# Patient Record
Sex: Female | Born: 1975
Health system: Southern US, Community
[De-identification: ages and names within clinical notes are randomized; demographics above are authoritative.]

## PROBLEM LIST (undated history)

## (undated) DIAGNOSIS — E785 Hyperlipidemia, unspecified: Secondary | ICD-10-CM

## (undated) DIAGNOSIS — E538 Deficiency of other specified B group vitamins: Secondary | ICD-10-CM

## (undated) DIAGNOSIS — G35 Multiple sclerosis: Secondary | ICD-10-CM

## (undated) DIAGNOSIS — D509 Iron deficiency anemia, unspecified: Secondary | ICD-10-CM

## (undated) DIAGNOSIS — D649 Anemia, unspecified: Secondary | ICD-10-CM

## (undated) DIAGNOSIS — Z8042 Family history of malignant neoplasm of prostate: Secondary | ICD-10-CM

## (undated) DIAGNOSIS — J189 Pneumonia, unspecified organism: Secondary | ICD-10-CM

## (undated) DIAGNOSIS — C50919 Malignant neoplasm of unspecified site of unspecified female breast: Secondary | ICD-10-CM

## (undated) DIAGNOSIS — G709 Myoneural disorder, unspecified: Secondary | ICD-10-CM

## (undated) DIAGNOSIS — Z803 Family history of malignant neoplasm of breast: Secondary | ICD-10-CM

## (undated) HISTORY — PX: VAGINA SURGERY: SHX829

## (undated) HISTORY — PX: WISDOM TOOTH EXTRACTION: SHX21

## (undated) HISTORY — DX: Iron deficiency anemia, unspecified: D50.9

## (undated) HISTORY — DX: Deficiency of other specified B group vitamins: E53.8

## (undated) HISTORY — DX: Pneumonia, unspecified organism: J18.9

## (undated) HISTORY — DX: Multiple sclerosis: G35

## (undated) HISTORY — DX: Family history of malignant neoplasm of breast: Z80.3

## (undated) HISTORY — DX: Anemia, unspecified: D64.9

## (undated) HISTORY — DX: Hyperlipidemia, unspecified: E78.5

## (undated) HISTORY — DX: Family history of malignant neoplasm of prostate: Z80.42

## (undated) HISTORY — DX: Myoneural disorder, unspecified: G70.9

---
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
## 2014-04-28 DIAGNOSIS — C50919 Malignant neoplasm of unspecified site of unspecified female breast: Secondary | ICD-10-CM

## 2014-04-28 HISTORY — DX: Malignant neoplasm of unspecified site of unspecified female breast: C50.919

## 2014-08-27 HISTORY — PX: BREAST LUMPECTOMY: SHX2

## 2014-08-27 HISTORY — PX: BREAST REDUCTION SURGERY: SHX8

## 2014-08-30 DIAGNOSIS — D0512 Intraductal carcinoma in situ of left breast: Secondary | ICD-10-CM | POA: Insufficient documentation

## 2014-09-22 DIAGNOSIS — G35 Multiple sclerosis: Secondary | ICD-10-CM | POA: Insufficient documentation

## 2014-09-22 DIAGNOSIS — E785 Hyperlipidemia, unspecified: Secondary | ICD-10-CM | POA: Insufficient documentation

## 2015-02-09 DIAGNOSIS — R7989 Other specified abnormal findings of blood chemistry: Secondary | ICD-10-CM | POA: Insufficient documentation

## 2017-12-17 DIAGNOSIS — Z9889 Other specified postprocedural states: Secondary | ICD-10-CM | POA: Diagnosis not present

## 2017-12-17 DIAGNOSIS — Z923 Personal history of irradiation: Secondary | ICD-10-CM | POA: Diagnosis not present

## 2017-12-17 DIAGNOSIS — D0512 Intraductal carcinoma in situ of left breast: Secondary | ICD-10-CM | POA: Diagnosis not present

## 2017-12-17 DIAGNOSIS — Z86000 Personal history of in-situ neoplasm of breast: Secondary | ICD-10-CM | POA: Diagnosis not present

## 2018-12-23 DIAGNOSIS — Z08 Encounter for follow-up examination after completed treatment for malignant neoplasm: Secondary | ICD-10-CM | POA: Diagnosis not present

## 2018-12-23 DIAGNOSIS — Z86000 Personal history of in-situ neoplasm of breast: Secondary | ICD-10-CM | POA: Diagnosis not present

## 2018-12-23 DIAGNOSIS — Z923 Personal history of irradiation: Secondary | ICD-10-CM | POA: Diagnosis not present

## 2018-12-23 DIAGNOSIS — D0512 Intraductal carcinoma in situ of left breast: Secondary | ICD-10-CM | POA: Diagnosis not present

## 2019-02-01 ENCOUNTER — Encounter: Payer: Self-pay | Admitting: Family

## 2019-02-01 ENCOUNTER — Other Ambulatory Visit: Payer: Self-pay

## 2019-02-01 ENCOUNTER — Ambulatory Visit (INDEPENDENT_AMBULATORY_CARE_PROVIDER_SITE_OTHER): Payer: BC Managed Care – PPO | Admitting: Family

## 2019-02-01 ENCOUNTER — Other Ambulatory Visit (HOSPITAL_COMMUNITY)
Admission: RE | Admit: 2019-02-01 | Discharge: 2019-02-01 | Disposition: A | Discharge status: Home / Self Care | Payer: BC Managed Care – PPO | Source: Ambulatory Visit | Attending: Family | Admitting: Family

## 2019-02-01 VITALS — BP 90/71 | HR 70 | Temp 97.3°F | Resp 16 | Ht 63.4 in | Wt 290.0 lb

## 2019-02-01 DIAGNOSIS — Z01419 Encounter for gynecological examination (general) (routine) without abnormal findings: Secondary | ICD-10-CM | POA: Diagnosis not present

## 2019-02-01 DIAGNOSIS — G35 Multiple sclerosis: Secondary | ICD-10-CM

## 2019-02-01 DIAGNOSIS — Z0000 Encounter for general adult medical examination without abnormal findings: Secondary | ICD-10-CM

## 2019-02-01 DIAGNOSIS — Z853 Personal history of malignant neoplasm of breast: Secondary | ICD-10-CM | POA: Diagnosis not present

## 2019-02-01 LAB — HEPATIC FUNCTION PANEL
ALT: 12 U/L (ref 0–35)
AST: 12 U/L (ref 0–37)
Albumin: 3.7 g/dL (ref 3.5–5.2)
Alkaline Phosphatase: 83 U/L (ref 39–117)
Bilirubin, Direct: 0.1 mg/dL (ref 0.0–0.3)
Total Bilirubin: 0.3 mg/dL (ref 0.2–1.2)
Total Protein: 7 g/dL (ref 6.0–8.3)

## 2019-02-01 LAB — CBC WITH DIFFERENTIAL/PLATELET
Basophils Absolute: 0.1 10*3/uL (ref 0.0–0.1)
Basophils Relative: 0.8 % (ref 0.0–3.0)
Eosinophils Absolute: 0.1 10*3/uL (ref 0.0–0.7)
Eosinophils Relative: 1.6 % (ref 0.0–5.0)
HCT: 36.9 % (ref 36.0–46.0)
Hemoglobin: 11.7 g/dL — ABNORMAL LOW (ref 12.0–15.0)
Lymphocytes Relative: 23.4 % (ref 12.0–46.0)
Lymphs Abs: 1.7 10*3/uL (ref 0.7–4.0)
MCHC: 31.7 g/dL (ref 30.0–36.0)
MCV: 79.5 fl (ref 78.0–100.0)
Monocytes Absolute: 0.5 10*3/uL (ref 0.1–1.0)
Monocytes Relative: 6.5 % (ref 3.0–12.0)
Neutro Abs: 5 10*3/uL (ref 1.4–7.7)
Neutrophils Relative %: 67.7 % (ref 43.0–77.0)
Platelets: 311 10*3/uL (ref 150.0–400.0)
RBC: 4.64 Mil/uL (ref 3.87–5.11)
RDW: 16.8 % — ABNORMAL HIGH (ref 11.5–15.5)
WBC: 7.5 10*3/uL (ref 4.0–10.5)

## 2019-02-01 LAB — BASIC METABOLIC PANEL
BUN: 12 mg/dL (ref 6–23)
CO2: 30 mEq/L (ref 19–32)
Calcium: 9 mg/dL (ref 8.4–10.5)
Chloride: 98 mEq/L (ref 96–112)
Creatinine, Ser: 0.47 mg/dL (ref 0.40–1.20)
GFR: 143.43 mL/min (ref >60.00)
Glucose, Bld: 89 mg/dL (ref 70–99)
Potassium: 3.6 mEq/L (ref 3.5–5.1)
Sodium: 137 mEq/L (ref 135–145)

## 2019-02-01 LAB — LIPID PANEL
Cholesterol: 203 mg/dL — ABNORMAL HIGH (ref 0–200)
HDL: 57.2 mg/dL (ref >39.00)
LDL Cholesterol: 130 mg/dL — ABNORMAL HIGH (ref 0–99)
NonHDL: 146.05
Total CHOL/HDL Ratio: 4
Triglycerides: 81 mg/dL (ref 0.0–149.0)
VLDL: 16.2 mg/dL (ref 0.0–40.0)

## 2019-02-01 LAB — TSH: TSH: 1.77 u[IU]/mL (ref 0.35–4.50)

## 2019-02-01 NOTE — Progress Notes (Signed)
Subjective:    Patient ID: Elizabeth Dixon, female    DOB: 15-Dec-1975, 43 y.o.   MRN: [id number]  HPI   Patient is a 43 yr old female who presents today to establish care.   Pmhx is significant for:  Multiple sclerosis-diagnosed 2002, due to numbness. Had LP and MRI scans at that time. Did take copaxone for abou 4-5 years. She stopped taking to try to become pregnant. Has been off meds and asymptomatic since that time.  She has not seen a neurologist recently.    Breast Cancer- (DCIS) left breast s/p lumpectomy 2016. Then had radiation, no chemo.  She is followed by annual mammograms.  Reports mammo is up to date  Reports no specific concerns today.   Preventative care:    Immunizations: flu shot 9/14, tetanus 2010 Diet: she is down 41 pounds since June She has been doing Keto diet.  Exercise: walks some Pap Smear: due Mammogram: reports up to date    Review of Systems  Constitutional: Negative for unexpected weight change.  HENT: Negative for hearing loss and rhinorrhea.   Eyes: Negative for visual disturbance.  Respiratory: Negative for cough and shortness of breath.   Cardiovascular: Negative for chest pain and leg swelling.  Gastrointestinal: Negative for constipation and diarrhea.  Genitourinary: Negative for dysuria, frequency, hematuria and menstrual problem.  Musculoskeletal: Negative for arthralgias and myalgias.  Skin: Negative for rash.  Neurological: Negative for headaches.  Hematological: Negative for adenopathy.  Psychiatric/Behavioral:       Denies depression/anxiety   Past Medical History:  Diagnosis Date  . Breast cancer ([place])    left breast  . Multiple sclerosis ([place])      Social History   Socioeconomic History  . Marital status: Married    Spouse name: Not on file  . Number of children: Not on file  . Years of education: Not on file  . Highest education level: Not on file  Occupational History  . Occupation: [employer] features editor  Social Needs  .  Financial resource strain: Not hard at all  . Food insecurity    Worry: Never true    Inability: Never true  . Transportation needs    Medical: No    Non-medical: No  Tobacco Use  . Smoking status: Former Smoker    Types: Cigarettes    Quit date: 2006    Years since quitting: 14.7  . Smokeless tobacco: Never Used  Substance and Sexual Activity  . Alcohol use: Yes    Alcohol/week: 1.0 - 2.0 standard drinks    Types: 1 - 2 Shots of liquor per week    Comment: once a wk  . Drug use: Never  . Sexual activity: Yes    Partners: Male  Lifestyle  . Physical activity    Days per week: 0 days    Minutes per session: Not on file  . Stress: Not on file  Relationships  . Social connections    Talks on phone: More than three times a week    Gets together: Once a week    Attends religious service: More than 4 times per year    Active member of club or organization: Not on file    Attends meetings of clubs or organizations: Not on file    Relationship status: Not on file  . Intimate partner violence    Fear of current or ex partner: No    Emotionally abused: No    Physically abused: No  Forced sexual activity: No  Other Topics Concern  . Not on file  Social History Narrative   Works as an Geologist, engineering   Married-  [place] 2013 son and 2011 daughter   Dorie Rank from [place]   Enjoys children's sports   Enjoys outside activities   dog   Complete bachelors degree    Past Surgical History:  Procedure Laterality Date  . BREAST LUMPECTOMY Left 08/2014  . BREAST REDUCTION SURGERY Bilateral 08/2014  . CESAREAN SECTION  2013    Family History  Problem Relation Age of Onset  . Thyroid disease Mother   . Hypertension Father   . Prostate cancer Father     Not on File  Current Outpatient Medications on File Prior to Visit  Medication Sig Dispense Refill  . Melatonin 1 MG TABS Take by mouth.    . Multiple Vitamins-Minerals (VITAMIN D3 COMPLETE PO) Take by mouth.     No current  facility-administered medications on file prior to visit.     BP 90/71 (BP Location: Right Arm, Patient Position: Sitting, Cuff Size: Large)   Pulse 70   Temp (!) 97.3 F (36.3 C) (Temporal)   Resp 16   Ht 5' 3.4" (1.61 m)   Wt 290 lb (131.5 kg)   SpO2 100%   BMI 50.73 kg/m        Objective:   Physical Exam  Physical Exam  Constitutional: She is oriented to person, place, and time. She appears well-developed and well-nourished. No distress.  HENT:  Head: Normocephalic and atraumatic.  Right Ear: Tympanic membrane and ear canal normal.  Left Ear: Tympanic membrane and ear canal normal.  Mouth/Throat: not examined,pt wearing mask for covid-19 precautions Eyes: Pupils are equal, round, and reactive to light. No scleral icterus.  Neck: Normal range of motion. No thyromegaly present.  Cardiovascular: Normal rate and regular rhythm.   No murmur heard. Pulmonary/Chest: Effort normal and breath sounds normal. No respiratory distress. He has no wheezes. She has no rales. She exhibits no tenderness.  Abdominal: Soft. Bowel sounds are normal. She exhibits no distension and no mass. There is no tenderness. There is no rebound and no guarding.  Musculoskeletal: She exhibits no edema.  Lymphadenopathy:    She has no cervical adenopathy.  Neurological: She is alert and oriented to person, place, and time. She has normal patellar reflexes. She exhibits normal muscle tone. Coordination normal.  Skin: Skin is warm and dry.  Psychiatric: She has a normal mood and affect. Her behavior is normal. Judgment and thought content normal.  Breasts: Examined lying, bilateral scars from breast reduction.  Some post-radiation changes left lower breast.  No obvious breast masses  Inguinal/mons: Normal without inguinal adenopathy  External genitalia: Normal  BUS/Urethra/Skene's glands: Normal  Bladder: Normal  Vagina: Normal  Cervix: Normal  Uterus: normal in size, shape and contour. Midline and mobile   Adnexa/parametria:  Rt: Without masses or tenderness.  Lt: Without masses or tenderness.  Anus and perineum: + external hemorrhoid noted            Assessment & Plan:   Preventative care- discussed healthy diet, exercise, weight loss. Pap performed today. Mammogram is up to date.  Flu shot up to date.  Will need tetanus next visit.   Multiple Sclerosis- clinically stable. Will refer to neurology for surveillance.  Hx of breast cancer- s/p lumpectomy and radiation.      Assessment & Plan:

## 2019-02-01 NOTE — Patient Instructions (Signed)
Please go to the lab before leaving today.

## 2019-02-08 LAB — CYTOLOGY - PAP
Diagnosis: NEGATIVE
High risk HPV: NEGATIVE

## 2019-02-09 ENCOUNTER — Telehealth: Payer: Self-pay

## 2019-02-09 NOTE — Telephone Encounter (Signed)
No notes

## 2019-02-14 ENCOUNTER — Other Ambulatory Visit: Payer: Self-pay

## 2019-02-14 DIAGNOSIS — D649 Anemia, unspecified: Secondary | ICD-10-CM

## 2019-02-17 ENCOUNTER — Other Ambulatory Visit (INDEPENDENT_AMBULATORY_CARE_PROVIDER_SITE_OTHER): Payer: BC Managed Care – PPO

## 2019-02-17 ENCOUNTER — Other Ambulatory Visit: Payer: Self-pay

## 2019-02-17 DIAGNOSIS — D649 Anemia, unspecified: Secondary | ICD-10-CM

## 2019-02-17 LAB — FERRITIN: Ferritin: 13.9 ng/mL (ref 10.0–291.0)

## 2019-02-17 LAB — IRON: Iron: 35 ug/dL — ABNORMAL LOW (ref 42–145)

## 2019-02-18 ENCOUNTER — Telehealth: Payer: Self-pay | Admitting: Family

## 2019-02-18 ENCOUNTER — Other Ambulatory Visit: Payer: Self-pay

## 2019-02-18 DIAGNOSIS — E611 Iron deficiency: Secondary | ICD-10-CM

## 2019-02-18 MED ORDER — FERROUS SULFATE 325 (65 FE) MG PO TABS: 325.0000 mg | ORAL_TABLET | Freq: Every day | ORAL | 3 refills | Status: DC

## 2019-02-18 NOTE — Telephone Encounter (Signed)
Unable to reach patient by phone left voice mail for her to check information forwarded to her as a MyChart message.

## 2019-02-18 NOTE — Telephone Encounter (Signed)
Please advise pt that her iron level is low. I would like for her to add iron 325mg  bid (available OTC).  Repeat cbc, serum iron, ferritin in 3 months. Dx iron def anemia.

## 2019-03-13 NOTE — Progress Notes (Signed)
NEUROLOGY CONSULTATION NOTE  Elizabeth Dixon MRN: [id number] DOB: 1976/01/12  Referring provider: [name], NP Primary care provider: [name], NP  Reason for consult:  Multiple sclerosis  HISTORY OF PRESENT ILLNESS: Elizabeth Dixon is a 43 year old right-handed Caucasian female with history of left breast cancer who presents for multiple sclerosis.  History supplemented by referring provider note.  She was diagnosed with multiple sclerosis in 2002 after presenting with numbness and tingling in her feet that spread up to mid thorax by mid-day.  Diagnosis was established via MRI and lumbar puncture CSF results.  She was treated with IV steroids for exacerbations.  On a couple of subsequent occasions, she would have various numbness.  She was initially on Copaxone and discontinued it around 2008-2009 when she tried to get pregnant.  She hasn't been on a DMT since then.  Repeat MRIs in the past showed no changes.  No exacerbations since then.  No prior history of optic neuritis or weakness.  Vision:  No issues Motor:  No issues Sensory:  No issues Pain:  No issues Gait:  No issues Bowel/Bladder:  No issues Fatigue:  No issues Cognition:  No issues Mood:  No issues.  Current DMT:  None Past DMT:  Copaxone (4 to 5 years; stopped when she tried getting pregnant)  Current medications/vitamins-supplements:  D3 (unsure of dose); melatonin 1mg ; ferrous sulfate 325mg  mg; MVI Past medications:  None  No family history of MS.  02/01/2019 LABS:  CBC with WBC 7.5, HGB 11.7, HCT 36.9, PLT 311; BMP with Na 137, K 3.6, Cl 98, CO2 30, glucose 89, BUN 12, Cr 0.47; hepatic panel with t bili 0.3, ALP 83, AST 12, ALT 12; TSH 1.77  PAST MEDICAL HISTORY: Past Medical History:  Diagnosis Date  . Breast cancer ([place])    left breast  . Multiple sclerosis ([place])     PAST SURGICAL HISTORY: Past Surgical History:  Procedure Laterality Date  . BREAST LUMPECTOMY Left 08/2014  . BREAST  REDUCTION SURGERY Bilateral 08/2014  . CESAREAN SECTION  2013    MEDICATIONS: Current Outpatient Medications on File Prior to Visit  Medication Sig Dispense Refill  . ferrous sulfate 325 (65 FE) MG tablet Take 1 tablet (325 mg total) by mouth daily with breakfast.  3  . Melatonin 1 MG TABS Take by mouth.    . Multiple Vitamins-Minerals (VITAMIN D3 COMPLETE PO) Take by mouth.     No current facility-administered medications on file prior to visit.     ALLERGIES: Not on File  FAMILY HISTORY: Family History  Problem Relation Age of Onset  . Thyroid disease Mother   . Hypertension Father   . Prostate cancer Father    SOCIAL HISTORY: Social History   Socioeconomic History  . Marital status: Married    Spouse name: Not on file  . Number of children: Not on file  . Years of education: Not on file  . Highest education level: Not on file  Occupational History  . Occupation: [employer] features editor  Social Needs  . Financial resource strain: Not hard at all  . Food insecurity    Worry: Never true    Inability: Never true  . Transportation needs    Medical: No    Non-medical: No  Tobacco Use  . Smoking status: Former Smoker    Types: Cigarettes    Quit date: 2006    Years since quitting: 14.8  . Smokeless tobacco: Never Used  Substance and Sexual Activity  .  Alcohol use: Yes    Alcohol/week: 1.0 - 2.0 standard drinks    Types: 1 - 2 Shots of liquor per week    Comment: once a wk  . Drug use: Never  . Sexual activity: Yes    Partners: Male  Lifestyle  . Physical activity    Days per week: 0 days    Minutes per session: Not on file  . Stress: Not on file  Relationships  . Social connections    Talks on phone: More than three times a week    Gets together: Once a week    Attends religious service: More than 4 times per year    Active member of club or organization: Not on file    Attends meetings of clubs or organizations: Not on file    Relationship status: Not on file  .  Intimate partner violence    Fear of current or ex partner: No    Emotionally abused: No    Physically abused: No    Forced sexual activity: No  Other Topics Concern  . Not on file  Social History Narrative   Works as an Geologist, engineering   Married-  [place] 2013 son and 2011 daughter   Moved from [place]   Enjoys children's sports   Enjoys outside activities   dog   Complete bachelors degree    REVIEW OF SYSTEMS: Constitutional: No fevers, chills, or sweats, no generalized fatigue, change in appetite Eyes: No visual changes, double vision, eye pain Ear, nose and throat: No hearing loss, ear pain, nasal congestion, sore throat Cardiovascular: No chest pain, palpitations Respiratory:  No shortness of breath at rest or with exertion, wheezes GastrointestinaI: No nausea, vomiting, diarrhea, abdominal pain, fecal incontinence Genitourinary:  No dysuria, urinary retention or frequency Musculoskeletal:  No neck pain, back pain Integumentary: No rash, pruritus, skin lesions Neurological: as above Psychiatric: No depression, insomnia, anxiety Endocrine: No palpitations, fatigue, diaphoresis, mood swings, change in appetite, change in weight, increased thirst Hematologic/Lymphatic:  No purpura, petechiae. Allergic/Immunologic: no itchy/runny eyes, nasal congestion, recent allergic reactions, rashes  PHYSICAL EXAM: Blood pressure 116/79, pulse 64, height 5\' 4"  (1.626 m), weight 285 lb (129.3 kg), SpO2 99 %. General: No acute distress.  Patient appears well-groomed.   Head:  Normocephalic/atraumatic Eyes:  fundi examined but not visualized Neck: supple, no paraspinal tenderness, full range of motion Back: No paraspinal tenderness Heart: regular rate and rhythm Lungs: Clear to auscultation bilaterally. Vascular: No carotid bruits. Neurological Exam: Mental status: alert and oriented to person, place, and time, recent and remote memory intact, fund of knowledge intact, attention and concentration  intact, speech fluent and not dysarthric, language intact. Cranial nerves: CN I: not tested CN II: pupils equal, round and reactive to light, visual fields intact CN III, IV, VI:  full range of motion, no nystagmus, no ptosis CN V: facial sensation intact CN VII: upper and lower face symmetric CN VIII: hearing intact CN IX, X: gag intact, uvula midline CN XI: sternocleidomastoid and trapezius muscles intact CN XII: tongue midline Bulk & Tone: normal, no fasciculations. Motor:  5/5 throughout  Sensation:  Pinprick and vibration sensation intact Deep Tendon Reflexes:  2+ throughout, toes downgoing.   Finger to nose testing:  Without dysmetria.   Heel to shin:  Without dysmetria.   Gait:  Normal station and stride.  Able to turn and tandem walk. Romberg negative Negative Lhermitte's sign.  IMPRESSION: Multiple sclerosis.  Clinically stable for many years off of DMT.  I recommended that she start a DMT as she does have a risk for future exacerbations.  PLAN: 1.  Check baseline MRI of brain and cervical spine with and without contrast.  Would repeat in one year (prior to follow up) 2.  Check vitamin D level.  She will contact us with dose of D3 and I can have her adjust dose accordingly if needed. 3.  Provided her information on Aubagio and Tecfidera.  She will consider and get back to [place] if she chooses to start one of these DMT. 4.  Follow up in one year (following repeat MRI).  If she decides to start DMT, I will also have her follow up in 6 months.  Thank you for allowing me to take part in the care of this patient.  Metta Clines, DO  CC: [name], NP

## 2019-03-15 ENCOUNTER — Encounter: Payer: Self-pay | Admitting: Neurology

## 2019-03-15 ENCOUNTER — Ambulatory Visit (INDEPENDENT_AMBULATORY_CARE_PROVIDER_SITE_OTHER): Payer: BC Managed Care – PPO | Admitting: Neurology

## 2019-03-15 ENCOUNTER — Other Ambulatory Visit (INDEPENDENT_AMBULATORY_CARE_PROVIDER_SITE_OTHER): Payer: BC Managed Care – PPO

## 2019-03-15 ENCOUNTER — Other Ambulatory Visit: Payer: Self-pay

## 2019-03-15 VITALS — BP 116/79 | HR 64 | Ht 64.0 in | Wt 285.0 lb

## 2019-03-15 DIAGNOSIS — G35 Multiple sclerosis: Secondary | ICD-10-CM | POA: Diagnosis not present

## 2019-03-15 LAB — VITAMIN D 25 HYDROXY (VIT D DEFICIENCY, FRACTURES): VITD: 36.56 ng/mL (ref 30.00–100.00)

## 2019-03-15 NOTE — Patient Instructions (Addendum)
1.  We will check MRI of brain and cervical spine with and without contrast 2.  We will check vitamin D level.  Contact us with dose of D3 3.  Consider starting one of the following medications:  Tecfidera  Aubagio 4.  If you decide to start a medication, follow up in 6 months, otherwise follow up in one year.  A referral to [facility] has been placed for your MRI someone will contact you directly to schedule your appt. They are located at [place]. Please contact them directly by calling [phone] with any questions regarding your referral.

## 2019-04-16 ENCOUNTER — Other Ambulatory Visit: Payer: Self-pay

## 2019-04-16 ENCOUNTER — Ambulatory Visit
Admission: RE | Admit: 2019-04-16 | Discharge: 2019-04-16 | Disposition: A | Discharge status: Home / Self Care | Payer: BC Managed Care – PPO | Source: Ambulatory Visit | Attending: Neurology | Admitting: Neurology

## 2019-04-16 DIAGNOSIS — G35 Multiple sclerosis: Secondary | ICD-10-CM

## 2019-04-16 DIAGNOSIS — M50223 Other cervical disc displacement at C6-C7 level: Secondary | ICD-10-CM | POA: Diagnosis not present

## 2019-04-16 IMAGING — MR MR CERVICAL SPINE WO/W CM
6 of 8 series · 34 of 48 positions shown · IV contrast (multihance)
Comparison: None.

CLINICAL DATA: Multiple sclerosis.

EXAM:
MRI HEAD WITHOUT AND WITH CONTRAST
MRI CERVICAL SPINE WITHOUT AND WITH CONTRAST
TECHNIQUE: Multiplanar, multiecho pulse sequences of the brain and surrounding
structures, and cervical spine, to include the craniocervical
junction and cervicothoracic junction, were obtained without and
with intravenous contrast.
CONTRAST:  20mL MULTIHANCE GADOBENATE DIMEGLUMINE 529 MG/ML IV SOLN

[Series 2: T1 · sagittal · 3.0mm · 0.82mm/px · 5 of 17 slices shown (1 of 2)]
[im 1/17]
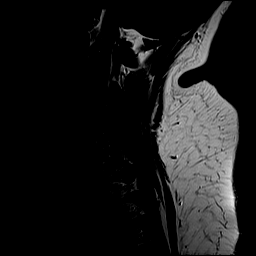
[im 5/17]
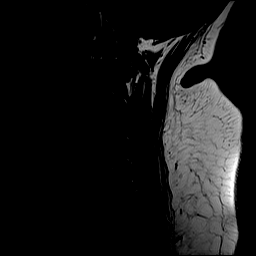
[im 9/17]
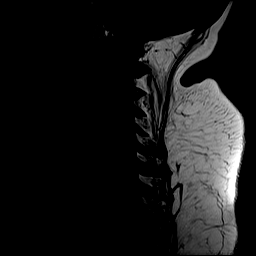
[im 13/17]
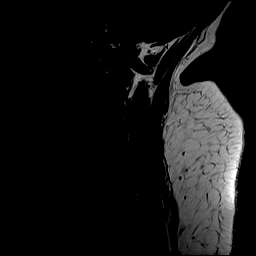
[im 17/17]
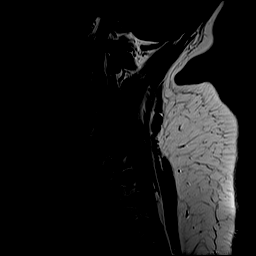

[Series 3: STIR · sagittal · 3.0mm · 0.82mm/px · 5 of 17 slices shown]
[im 1/17]
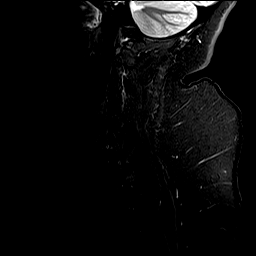
[im 5/17]
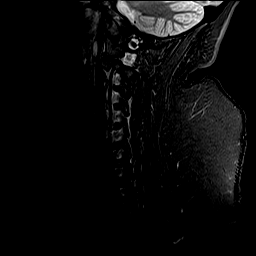
[im 9/17]
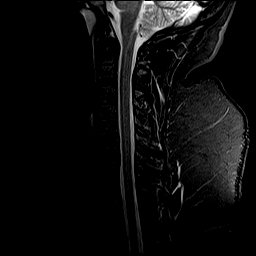
[im 13/17]
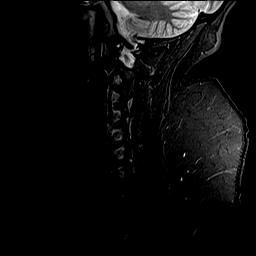
[im 17/17]
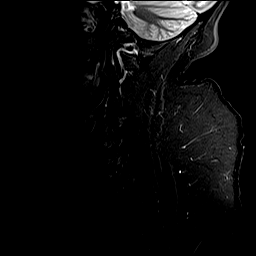

[Series 4: T2 · axial · 3.0mm · 0.70mm/px · z∈[-211,-122]mm · 7 of 25 slices shown (1 of 2)]
[im 1/25]
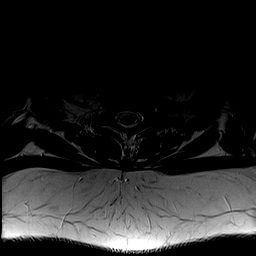
[im 5/25]
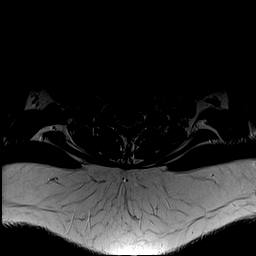
[im 9/25]
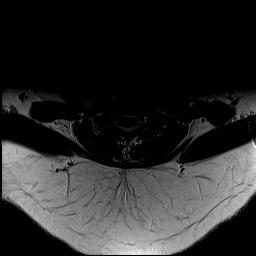
[im 13/25]
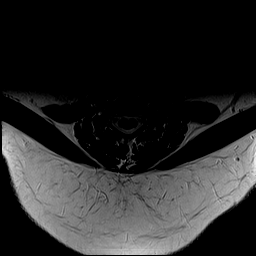
[im 17/25]
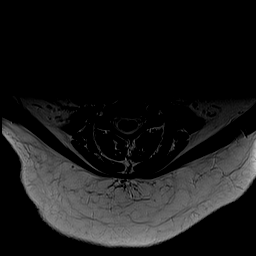
[im 21/25]
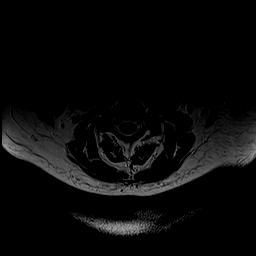
[im 25/25]
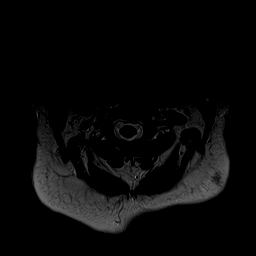

[Series 6: T1 · axial · 3.0mm · 0.35mm/px · z∈[-211,-122]mm · 7 of 25 slices shown (2 of 2)]
[im 1/25]
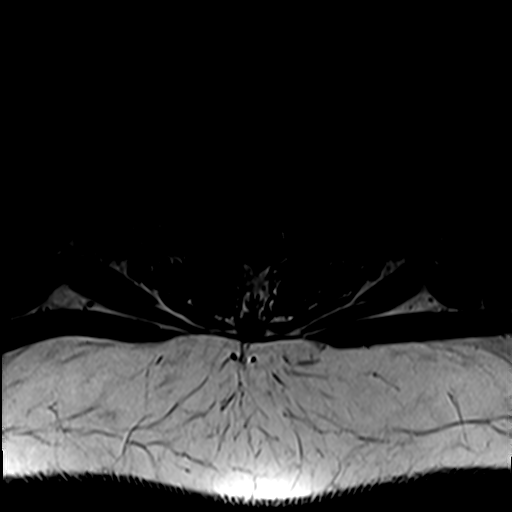
[im 5/25]
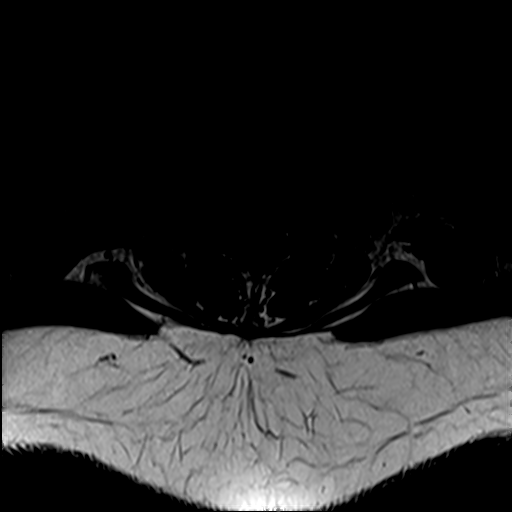
[im 9/25]
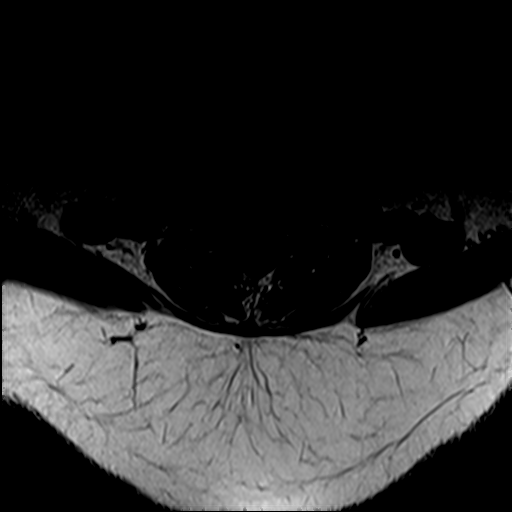
[im 13/25]
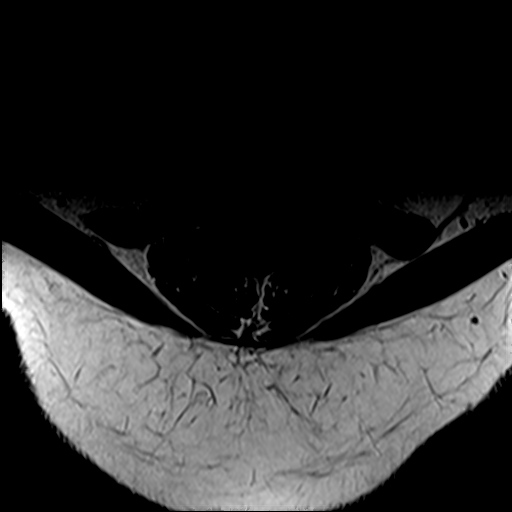
[im 17/25]
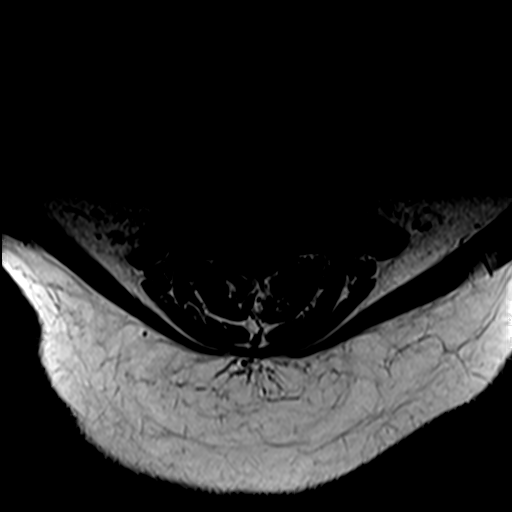
[im 21/25]
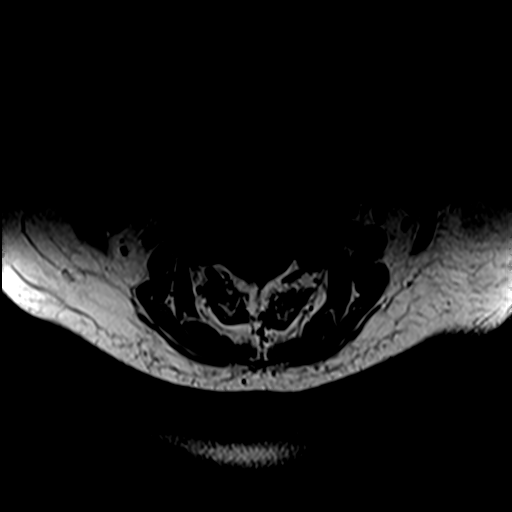
[im 25/25]
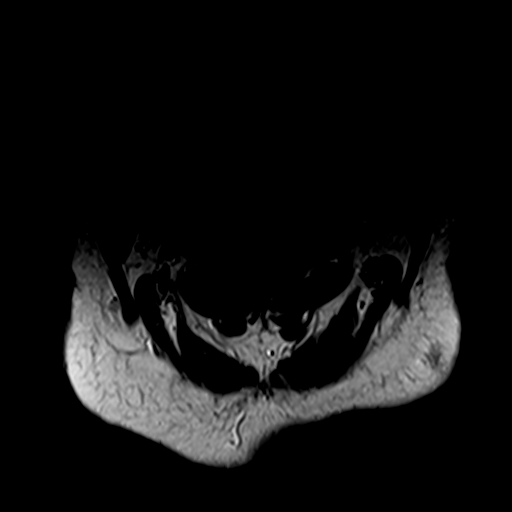

[Series 7: T2 · sagittal · 3.0mm · 0.41mm/px · 5 of 17 slices shown (2 of 2)]
[im 1/17]
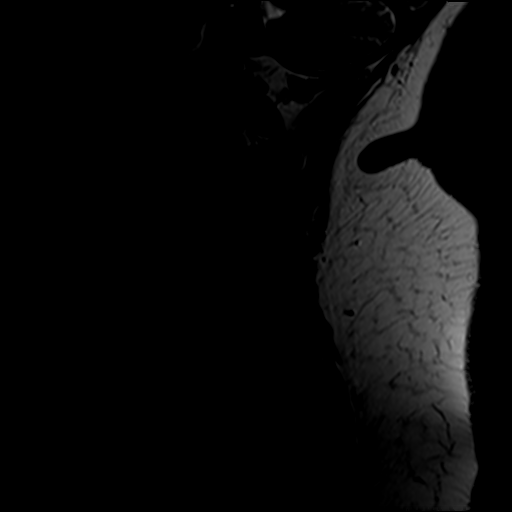
[im 5/17]
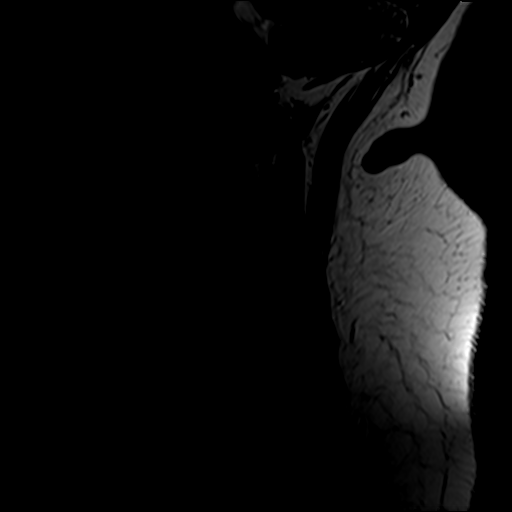
[im 9/17]
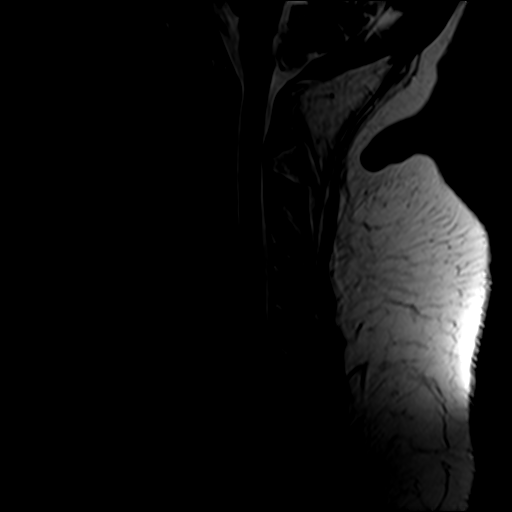
[im 13/17]
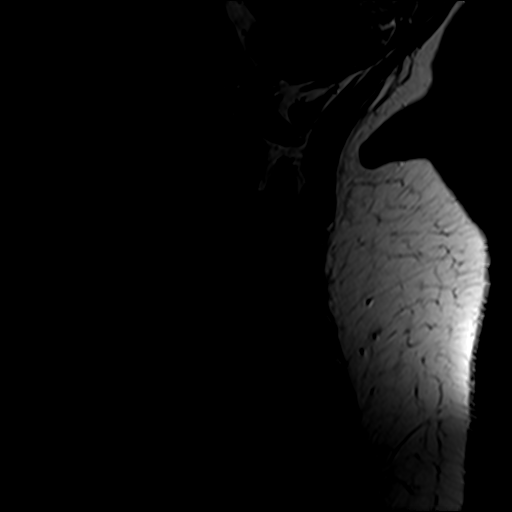
[im 17/17]
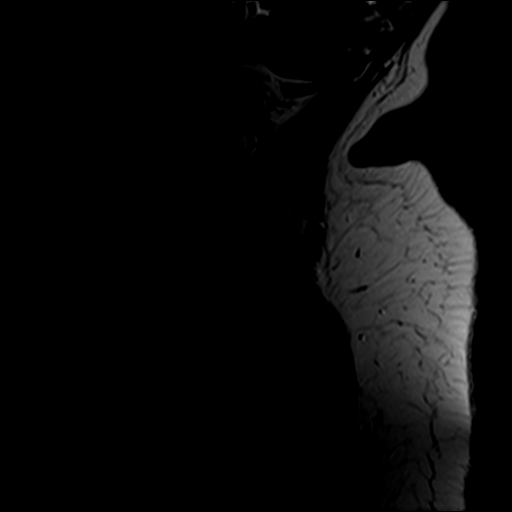

[Series 8: T1 fat-sat post-contrast · sagittal · 3.0mm · 0.82mm/px · 5 of 17 slices shown]
[im 1/17]
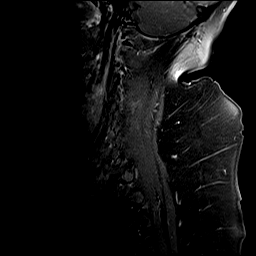
[im 5/17]
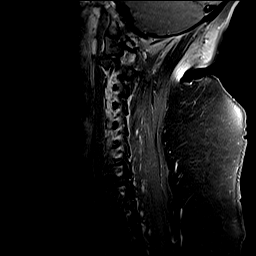
[im 9/17]
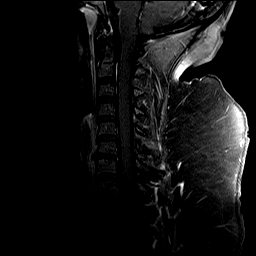
[im 13/17]
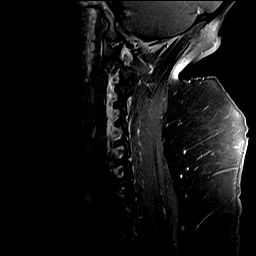
[im 17/17]
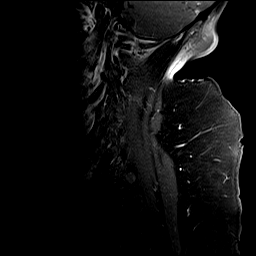

[34 of 48 positions shown; findings below may reference images not displayed]

FINDINGS: MRI HEAD FINDINGS

Brain: There is no evidence of acute infarct, intracranial
hemorrhage, mass, midline shift, or extra-axial fluid collection.
The ventricles and sulci are normal. There are 10-15 small foci of
T2 hyperintensity within the bilateral cerebral white matter. The
largest lesions are periventricular in location in the right
periatrial and posterior left temporal regions, and some lesions are
oriented perpendicularly to the lateral ventricles. Scattered
lesions are present in the deep and juxtacortical white matter. No
lesions demonstrate restricted diffusion or abnormal enhancement,
and no lesions are identified in the corpus callosum or posterior
fossa.

Vascular: Major intracranial vascular flow voids are preserved.

Skull and upper cervical spine: No suspicious marrow lesion.

Sinuses/Orbits: Unremarkable orbits. Mild mucosal thickening in the
paranasal sinuses. Trace right mastoid fluid.

Other: None.

MRI CERVICAL SPINE FINDINGS

Alignment: Cervical spine straightening. No listhesis.

Vertebrae: No fracture or suspicious marrow lesion. Mild disc space
narrowing at C6-7 with associated degenerative endplate changes
including minimal degenerative edema and enhancement.

Cord: Normal signal and morphology.

Posterior Fossa, vertebral arteries, paraspinal tissues:
Unremarkable.

Disc levels: Minimal disc bulging at C5-6 and C6-7. Widely patent
spinal canal and neural foramina.
IMPRESSION: 1. Mild cerebral white matter disease consistent with the provided
history of multiple sclerosis. No evidence of active demyelination.
2. Normal appearance of the cervical spinal cord.
3. Mild lower cervical spine disc degeneration without stenosis.

## 2019-04-16 IMAGING — MR MR HEAD WO/W CM
11 series · 44 of 48 positions shown · IV contrast (multihance)
Comparison: None.

CLINICAL DATA: Multiple sclerosis.

EXAM:
MRI HEAD WITHOUT AND WITH CONTRAST
MRI CERVICAL SPINE WITHOUT AND WITH CONTRAST
TECHNIQUE: Multiplanar, multiecho pulse sequences of the brain and surrounding
structures, and cervical spine, to include the craniocervical
junction and cervicothoracic junction, were obtained without and
with intravenous contrast.
CONTRAST:  20mL MULTIHANCE GADOBENATE DIMEGLUMINE 529 MG/ML IV SOLN

[Series 2: T1 · sagittal · 5.0mm · 0.47mm/px · 3 of 26 slices shown]
[im 1/26]
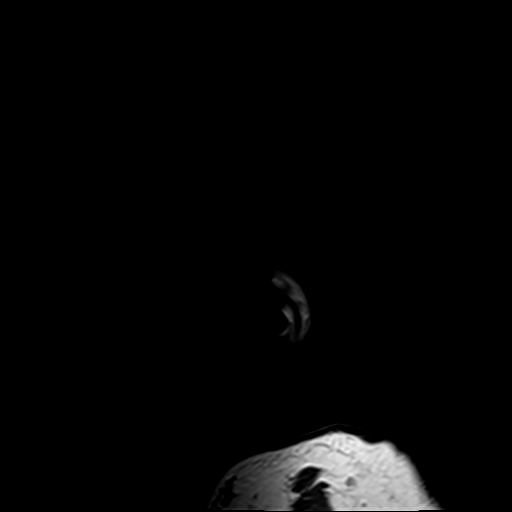
[im 13/26]
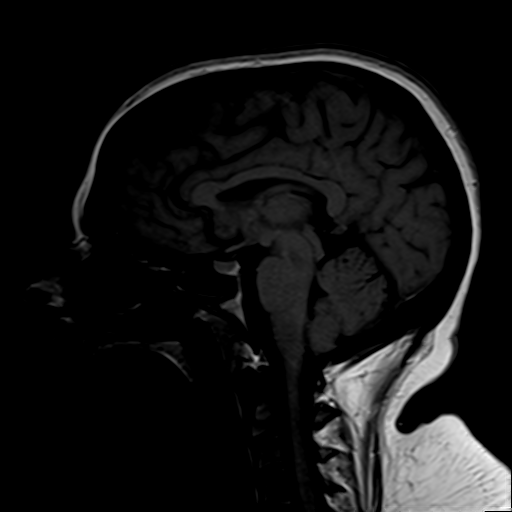
[im 26/26]
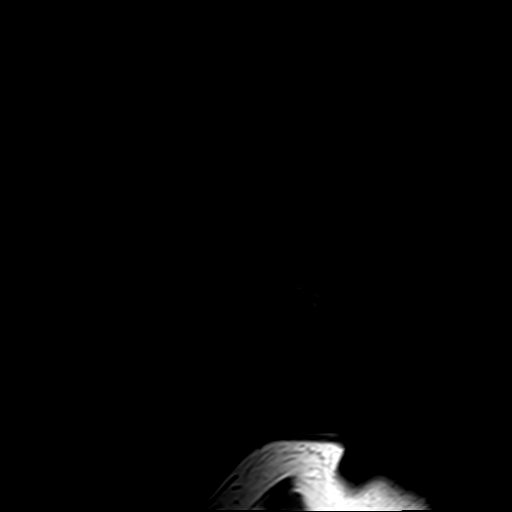

[Series 3: DWI · axial · 3.0mm · 1.88mm/px · z∈[-78,+72]mm · 7 of 102 slices shown (1 of 2)]
[im 1/102]
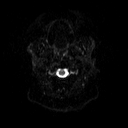
[im 17/102]
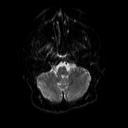
[im 34/102]
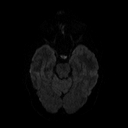
[im 51/102]
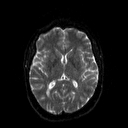
[im 68/102]
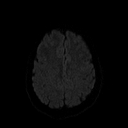
[im 85/102]
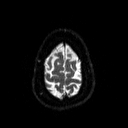
[im 102/102]
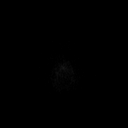

[Series 4: DWI · axial · 3.0mm · 1.88mm/px · z∈[-78,+72]mm · 4 of 51 slices shown (2 of 2)]
[im 1/51]
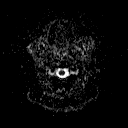
[im 17/51]
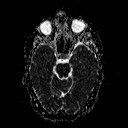
[im 34/51]
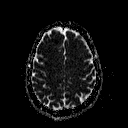
[im 51/51]
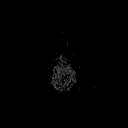

[Series 5: FLAIR · axial · 3.0mm · 0.47mm/px · z∈[-80,+73]mm · 2 of 34 slices shown (1 of 2)]
[im 1/34]
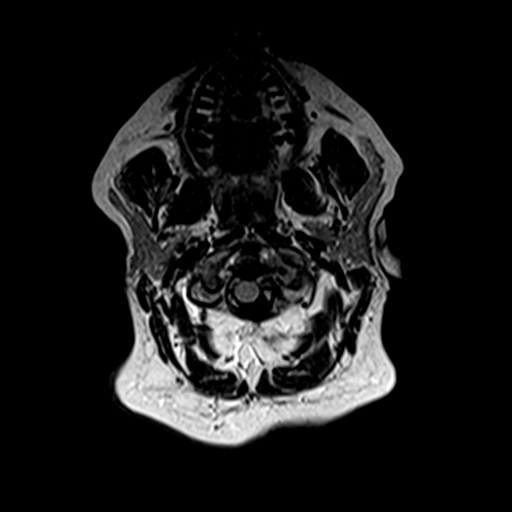
[im 34/34]
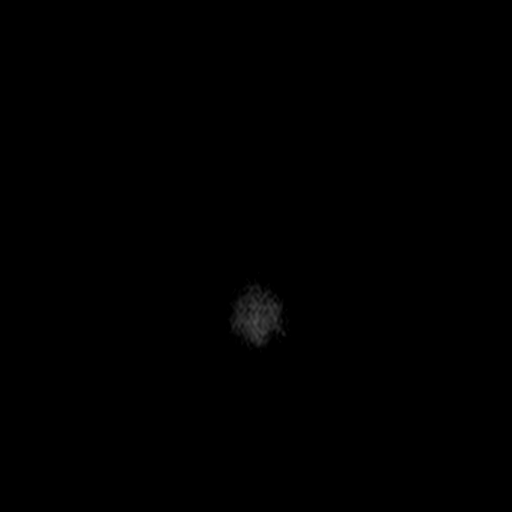

[Series 6: T2 · axial · 5.0mm · 0.62mm/px · z∈[-80,+75]mm · 2 of 24 slices shown (1 of 2)]
[im 1/24]
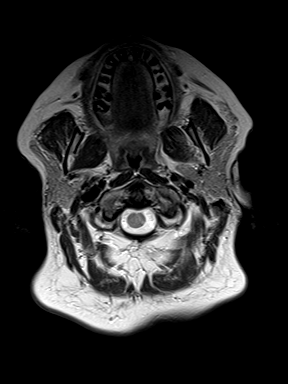
[im 24/24]
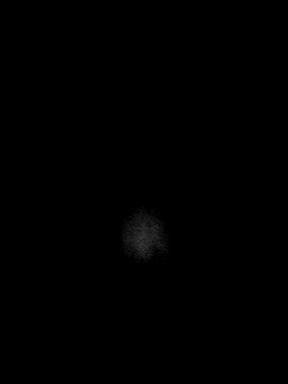

[Series 8: swi_images · axial · 5.0mm · 0.94mm/px · z∈[-77,+78]mm · 2 of 32 slices shown]
[im 1/32]
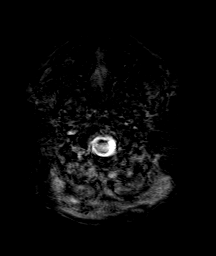
[im 32/32]
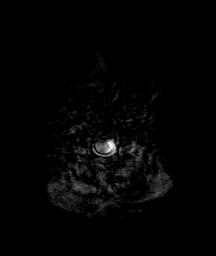

[Series 9: t1_mpr_tra · axial · 1.0mm · 0.75mm/px · z∈[-82,+77]mm · 10 of 160 slices shown (1 of 2)]
[im 1/160]
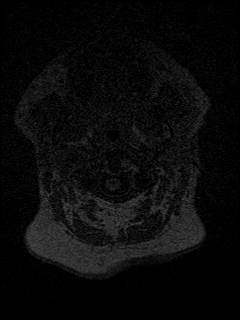
[im 16/160]
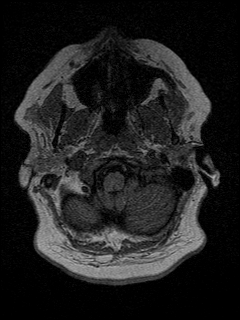
[im 32/160]
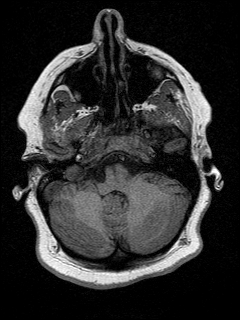
[im 48/160]
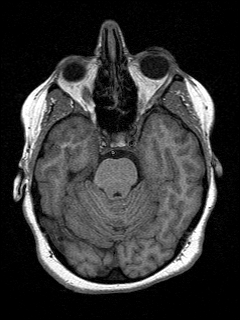
[im 64/160]
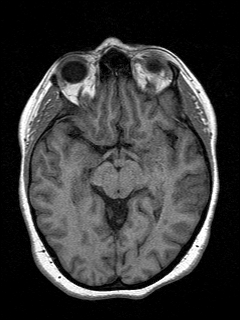
[im 80/160]
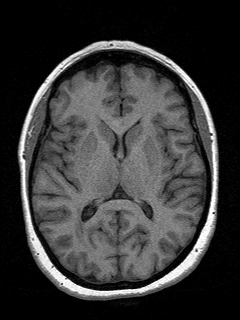
[im 96/160]
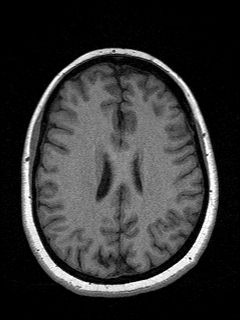
[im 112/160]
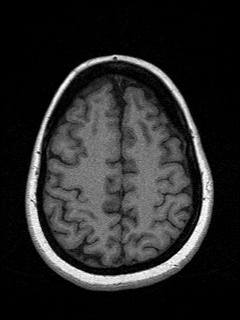
[im 128/160]
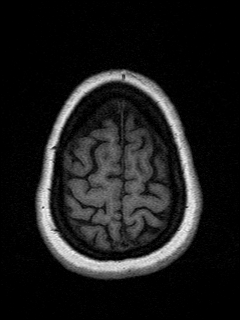
[im 160/160]
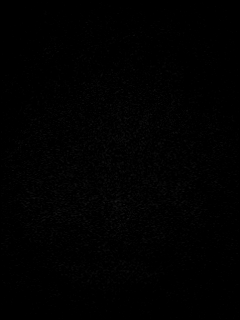

[Series 10: FLAIR · sagittal · 5.0mm · 0.45mm/px · 2 of 30 slices shown (2 of 2)]
[im 1/30]
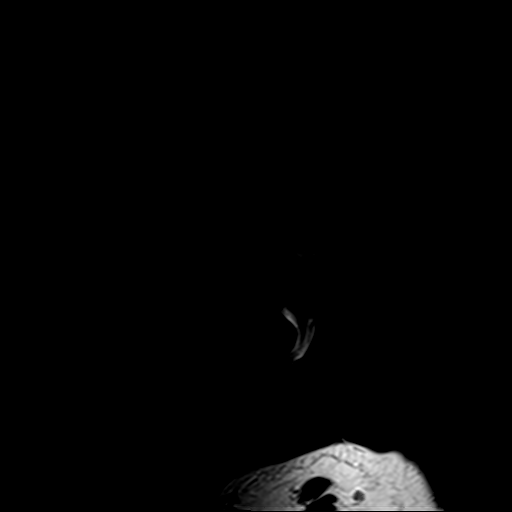
[im 30/30]
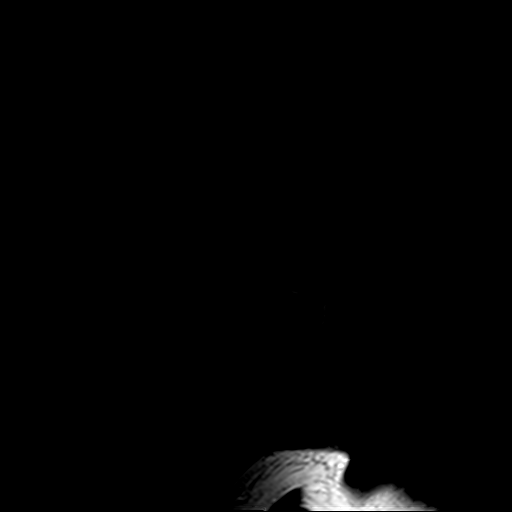

[Series 11: T2 · coronal · 5.0mm · 0.45mm/px · 2 of 29 slices shown (2 of 2)]
[im 1/29]
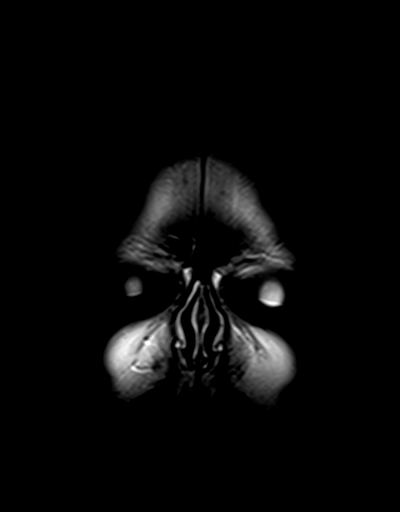
[im 29/29]
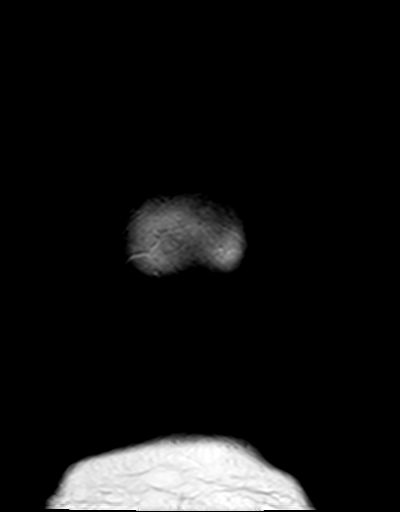

[Series 12: t1_mpr_tra · axial · 1.0mm · 0.75mm/px · z∈[-82,+77]mm · 8 of 160 slices shown (2 of 2)]
[im 1/160]
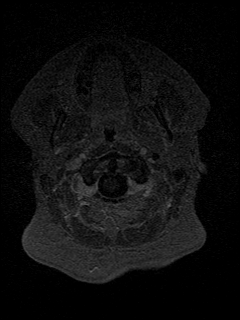
[im 32/160]
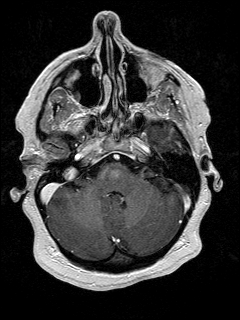
[im 48/160]
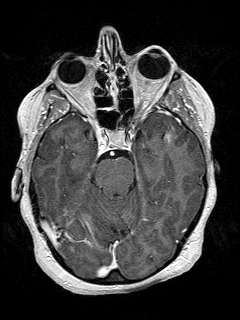
[im 64/160]
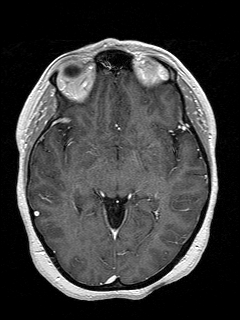
[im 96/160]
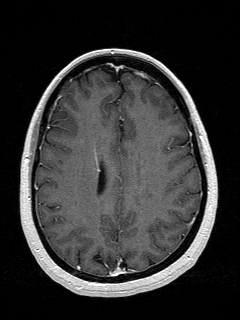
[im 112/160]
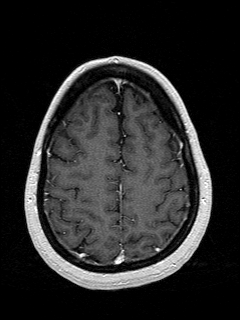
[im 128/160]
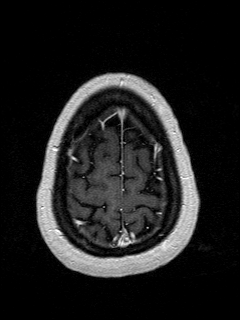
[im 160/160]
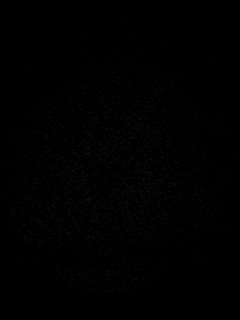

[Series 13: post cor · coronal · 5.0mm · 0.45mm/px · 2 of 29 slices shown]
[im 1/29]
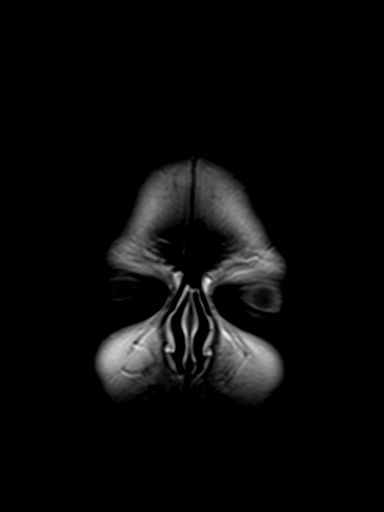
[im 29/29]
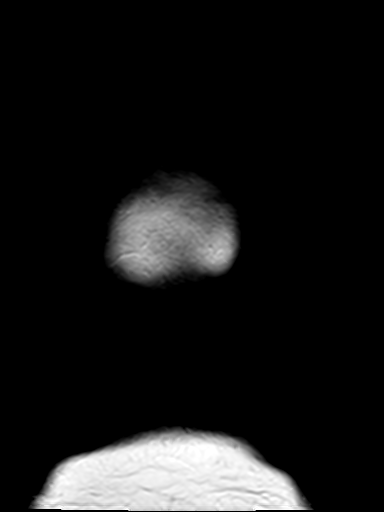

[44 of 48 positions shown; findings below may reference images not displayed]

FINDINGS: MRI HEAD FINDINGS

Brain: There is no evidence of acute infarct, intracranial
hemorrhage, mass, midline shift, or extra-axial fluid collection.
The ventricles and sulci are normal. There are 10-15 small foci of
T2 hyperintensity within the bilateral cerebral white matter. The
largest lesions are periventricular in location in the right
periatrial and posterior left temporal regions, and some lesions are
oriented perpendicularly to the lateral ventricles. Scattered
lesions are present in the deep and juxtacortical white matter. No
lesions demonstrate restricted diffusion or abnormal enhancement,
and no lesions are identified in the corpus callosum or posterior
fossa.

Vascular: Major intracranial vascular flow voids are preserved.

Skull and upper cervical spine: No suspicious marrow lesion.

Sinuses/Orbits: Unremarkable orbits. Mild mucosal thickening in the
paranasal sinuses. Trace right mastoid fluid.

Other: None.

MRI CERVICAL SPINE FINDINGS

Alignment: Cervical spine straightening. No listhesis.

Vertebrae: No fracture or suspicious marrow lesion. Mild disc space
narrowing at C6-7 with associated degenerative endplate changes
including minimal degenerative edema and enhancement.

Cord: Normal signal and morphology.

Posterior Fossa, vertebral arteries, paraspinal tissues:
Unremarkable.

Disc levels: Minimal disc bulging at C5-6 and C6-7. Widely patent
spinal canal and neural foramina.
IMPRESSION: 1. Mild cerebral white matter disease consistent with the provided
history of multiple sclerosis. No evidence of active demyelination.
2. Normal appearance of the cervical spinal cord.
3. Mild lower cervical spine disc degeneration without stenosis.

## 2019-04-16 MED ORDER — GADOBENATE DIMEGLUMINE 529 MG/ML IV SOLN
20.0000 mL | Freq: Once | INTRAVENOUS | Status: AC | PRN
  Administered 2019-04-16: 20 mL via INTRAVENOUS

## 2019-04-21 ENCOUNTER — Ambulatory Visit: Payer: Self-pay | Admitting: *Deleted

## 2019-04-21 NOTE — Telephone Encounter (Signed)
I returned her call.   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  She was not tested through the [facility] system.  She lives in [place], [place].  See Care Advice Notes.  She verbalized understanding and thanked me for my help.    Reason for Disposition . [1] [id number] diagnosed by positive lab test AND [2] mild symptoms (e.g., cough, fever, others) AND [id number] no complications or SOB  Answer Assessment - Initial Assessment Questions 1. COVID-19 DIAGNOSIS: "Who made your Coronavirus (COVID-19) diagnosis?" "Was it confirmed by a positive lab test?" If not diagnosed by a HCP, ask "Are there lots of cases (community spread) where you live?" (See public health department website, if unsure)     I am positive for COVID last night.   A week ago I'm having sinus pressure.   I work in a Theatre manager  2. COVID-19 EXPOSURE: "Was there any known exposure to COVID before the symptoms began?" CDC Definition of close contact: within 6 feet (2 meters) for a total of 15 minutes or more over a 24-hour period.      I was exposed at my work.    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 3. ONSET: "When did the COVID-19 symptoms start?"      My symptoms (sinus congestion/pressure) started on 04/13/2019.  I went over the 10 day quarantine protocol with her.    Her COVID-19 test was not done through the [facility] system.   She saw her test result on line last night.   I let her know someone should be contacting her regarding her result.   I let her know she needed to quarantine through 04/22/2019 and as long as her symptoms are resolving and she is fever free for 24 hours without using Tylenol/ibuprofen to keep the fever down she can end quarantine.   I instructed her to seek medical attention at an urgent care of ED if she developed shortness of breath or chest pressure/tightness.  I also let her know her  family would need to quarantine for 14 days.   She verbalized understanding.    I answered her questions.   I made OTC recommendations for her nasal congestion and sinus pressure. 4. WORST SYMPTOM: "What is your worst symptom?" (e.g., cough, fever, shortness of breath, muscle aches)     Sinus pressure 5. COUGH: "Do you have a cough?" If so, ask: "How bad is the cough?"       No 6. FEVER: "Do you have a fever?" If so, ask: "What is your temperature, how was it measured, and when did it start?"     No 7. RESPIRATORY STATUS: "Describe your breathing?" (e.g., shortness of breath, wheezing, unable to speak)      No 8. BETTER-SAME-WORSE: "Are you getting better, staying the same or getting worse compared to yesterday?"  If getting worse, ask, "In what way?"     A little better 9. HIGH RISK DISEASE: "Do you have any chronic medical problems?" (e.g., asthma, heart or lung disease, weak immune system, obesity, etc.)     Not asked 10. PREGNANCY: "Is there any chance you are pregnant?" "When was your last menstrual period?"       Not asked due to age 68. OTHER SYMPTOMS: "Do you have any other symptoms?"  (e.g., chills, fatigue, headache, loss of smell or taste, muscle pain, sore throat;  new loss of smell or taste especially support the diagnosis of COVID-19)       Loss of smell 2-3 days ago.  Protocols used: CORONAVIRUS (COVID-19) DIAGNOSED OR SUSPECTED-A-[facility]

## 2019-11-18 ENCOUNTER — Ambulatory Visit: Payer: BC Managed Care – PPO | Admitting: Family

## 2019-11-18 ENCOUNTER — Other Ambulatory Visit: Payer: Self-pay

## 2019-11-18 ENCOUNTER — Encounter: Payer: Self-pay | Admitting: Family

## 2019-11-18 VITALS — BP 102/70 | HR 86 | Resp 17 | Ht 64.0 in | Wt 258.0 lb

## 2019-11-18 DIAGNOSIS — Z3201 Encounter for pregnancy test, result positive: Secondary | ICD-10-CM

## 2019-11-18 LAB — POCT URINE PREGNANCY: Preg Test, Ur: POSITIVE — AB

## 2019-11-18 LAB — HCG, QUANTITATIVE, PREGNANCY: Quantitative HCG: 597.72 m[IU]/mL

## 2019-11-18 NOTE — Patient Instructions (Addendum)
Please complete lab work prior to leaving. Schedule an appointment with OB/GYN- here are some numbers in [place] for OB/GYN's.  [facility]- [phone] [facility]- [phone]  Please begin a pre-natal vitamin. Congratulations!   First Trimester of Pregnancy  The first trimester of pregnancy is from week 1 until the end of week 13 (months 1 through 3). During this time, your baby will begin to develop inside you. At 6-8 weeks, the eyes and face are formed, and the heartbeat can be seen on ultrasound. At the end of 12 weeks, all the baby's organs are formed. Prenatal care is all the medical care you receive before the birth of your baby. Make sure you get good prenatal care and follow all of your doctor's instructions. Follow these instructions at home: Medicines  Take over-the-counter and prescription medicines only as told by your doctor. Some medicines are safe and some medicines are not safe during pregnancy.  Take a prenatal vitamin that contains at least 600 micrograms (mcg) of folic acid.  If you have trouble pooping (constipation), take medicine that will make your stool soft (stool softener) if your doctor approves. Eating and drinking   Eat regular, healthy meals.  Your doctor will tell you the amount of weight gain that is right for you.  Avoid raw meat and uncooked cheese.  If you feel sick to your stomach (nauseous) or throw up (vomit): ? Eat 4 or 5 small meals a day instead of 3 large meals. ? Try eating a few soda crackers. ? Drink liquids between meals instead of during meals.  To prevent constipation: ? Eat foods that are high in fiber, like fresh fruits and vegetables, whole grains, and beans. ? Drink enough fluids to keep your pee (urine) clear or pale yellow. Activity  Exercise only as told by your doctor. Stop exercising if you have cramps or pain in your lower belly (abdomen) or low back.  Do not exercise if it is too hot, too humid, or if  you are in a place of great height (high altitude).  Try to avoid standing for long periods of time. Move your legs often if you must stand in one place for a long time.  Avoid heavy lifting.  Wear low-heeled shoes. Sit and stand up straight.  You can have sex unless your doctor tells you not to. Relieving pain and discomfort  Wear a good support bra if your breasts are sore.  Take warm water baths (sitz baths) to soothe pain or discomfort caused by hemorrhoids. Use hemorrhoid cream if your doctor says it is okay.  Rest with your legs raised if you have leg cramps or low back pain.  If you have puffy, bulging veins (varicose veins) in your legs: ? Wear support hose or compression stockings as told by your doctor. ? Raise (elevate) your feet for 15 minutes, 3-4 times a day. ? Limit salt in your food. Prenatal care  Schedule your prenatal visits by the twelfth week of pregnancy.  Write down your questions. Take them to your prenatal visits.  Keep all your prenatal visits as told by your doctor. This is important. Safety  Wear your seat belt at all times when driving.  Make a list of emergency phone numbers. The list should include numbers for family, friends, the hospital, and police and fire departments. General instructions  Ask your doctor for a referral to a local prenatal class. Begin classes no later than at the start of month 6 of your pregnancy.  Ask for help if you need counseling or if you need help with nutrition. Your doctor can give you advice or tell you where to go for help.  Do not use hot tubs, steam rooms, or saunas.  Do not douche or use tampons or scented sanitary pads.  Do not cross your legs for long periods of time.  Avoid all herbs and alcohol. Avoid drugs that are not approved by your doctor.  Do not use any tobacco products, including cigarettes, chewing tobacco, and electronic cigarettes. If you need help quitting, ask your doctor. You may get  counseling or other support to help you quit.  Avoid cat litter boxes and soil used by cats. These carry germs that can cause birth defects in the baby and can cause a loss of your baby (miscarriage) or stillbirth.  Visit your dentist. At home, brush your teeth with a soft toothbrush. Be gentle when you floss. Contact a doctor if:  You are dizzy.  You have mild cramps or pressure in your lower belly.  You have a nagging pain in your belly area.  You continue to feel sick to your stomach, you throw up, or you have watery poop (diarrhea).  You have a bad smelling fluid coming from your vagina.  You have pain when you pee (urinate).  You have increased puffiness (swelling) in your face, hands, legs, or ankles. Get help right away if:  You have a fever.  You are leaking fluid from your vagina.  You have spotting or bleeding from your vagina.  You have very bad belly cramping or pain.  You gain or lose weight rapidly.  You throw up blood. It may look like coffee grounds.  You are around people who have [place] measles, fifth disease, or chickenpox.  You have a very bad headache.  You have shortness of breath.  You have any kind of trauma, such as from a fall or a car accident. Summary  The first trimester of pregnancy is from week 1 until the end of week 13 (months 1 through 3).  To take care of yourself and your unborn baby, you will need to eat healthy meals, take medicines only if your doctor tells you to do so, and do activities that are safe for you and your baby.  Keep all follow-up visits as told by your doctor. This is important as your doctor will have to ensure that your baby is healthy and growing well. This information is not intended to replace advice given to you by your health care provider. Make sure you discuss any questions you have with your health care provider. Document Revised: 08/05/2018 Document Reviewed: 04/22/2016 Elsevier Patient Education  2020  [employer].

## 2019-11-18 NOTE — Progress Notes (Signed)
Subjective:    Patient ID: Elizabeth Dixon, female    DOB: [birth year], 44 y.o.   MRN: [id number]  HPI  Patient is a 44 yr old female who presents today to discuss a positive pregnancy test.  She is accompanied today by her husband.  She and her husband are excited but also very surprised about this pregnancy. Reports that her LMP was 10/12/19.   Denies nausea, headache.  She has had some mild breast soreness.   Review of Systems See HPI  Past Medical History:  Diagnosis Date  . Breast cancer ([place])    left breast  . Multiple sclerosis ([place])      Social History   Socioeconomic History  . Marital status: Married    Spouse name: Not on file  . Number of children: Not on file  . Years of education: Not on file  . Highest education level: Not on file  Occupational History  . 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  Tobacco Use  . Smoking status: Former Smoker    Types: Cigarettes    Quit date: 2006    Years since quitting: 15.5  . Smokeless tobacco: Never Used  Vaping Use  . Vaping Use: Never used  Substance and Sexual Activity  . Alcohol use: Yes    Alcohol/week: 1.0 - 2.0 standard drink    Types: 1 - 2 Shots of liquor per week    Comment: once a wk  . Drug use: Never  . Sexual activity: Yes    Partners: Male  Other Topics Concern  . Not on file  Social History Narrative   Works as an Geologist, engineering   Married-  [place] 2013 son and 2011 daughter   Dorie Rank from [place]   Enjoys children's sports   Enjoys outside activities   dog   Complete bachelors degree      Right handed   Social Determinants of Health   Financial Resource Strain: Low Risk   . Difficulty of Paying Living Expenses: Not hard at all  Food Insecurity: No Food Insecurity  . Worried About Charity fundraiser in the Last Year: Never true  . Ran Out of Food in the Last Year: Never true  Transportation Needs: No Transportation Needs  . Lack of Transportation (Medical): No  . Lack of Transportation (Non-Medical): No    Physical Activity: Unknown  . Days of Exercise per Week: 0 days  . Minutes of Exercise per Session: Not on file  Stress:   . Feeling of Stress :   Social Connections: Unknown  . Frequency of Communication with Friends and Family: More than three times a week  . Frequency of Social Gatherings with Friends and Family: Once a week  . Attends Religious Services: More than 4 times per year  . Active Member of Clubs or Organizations: Not on file  . Attends Archivist Meetings: Not on file  . Marital Status: Not on file  Intimate Partner Violence: Not At Risk  . Fear of Current or Ex-Partner: No  . Emotionally Abused: No  . Physically Abused: No  . Sexually Abused: No    Past Surgical History:  Procedure Laterality Date  . BREAST LUMPECTOMY Left 08/2014  . BREAST REDUCTION SURGERY Bilateral 08/2014  . CESAREAN SECTION  2013    Family History  Problem Relation Age of Onset  . Thyroid disease Mother   . Hypertension Father   . Prostate cancer Father     No Known Allergies  Current  Outpatient Medications on File Prior to Visit  Medication Sig Dispense Refill  . ferrous sulfate 325 (65 FE) MG tablet Take 1 tablet (325 mg total) by mouth daily with breakfast.  3  . Multiple Vitamins-Minerals (VITAMIN D3 COMPLETE PO) Take by mouth.     No current facility-administered medications on file prior to visit.    LMP 10/12/2019       Objective:   Physical Exam Constitutional:      Appearance: She is well-developed.  Cardiovascular:     Rate and Rhythm: Normal rate and regular rhythm.     Heart sounds: Normal heart sounds. No murmur heard.   Pulmonary:     Effort: Pulmonary effort is normal. No respiratory distress.     Breath sounds: Normal breath sounds. No wheezing.  Psychiatric:        Behavior: Behavior normal.        Thought Content: Thought content normal.        Judgment: Judgment normal.           Assessment & Plan:  Early Pregnancy- POC HCG is  positive today in the office. Advised pt to add a prenatal vitamin daily. We discussed foods to avoid during pregnancy as well as importance of regular exercise such as walking. She was given some numbers of OB/GYN's to call to schedule a Prenatal visit. She prefers to receive her OB care closer to home in [place].    This visit occurred during the SARS-CoV-2 public health emergency.  Safety protocols were in place, including screening questions prior to the visit, additional usage of staff PPE, and extensive cleaning of exam room while observing appropriate contact time as indicated for disinfecting solutions.

## 2019-11-29 DIAGNOSIS — O034 Incomplete spontaneous abortion without complication: Secondary | ICD-10-CM | POA: Diagnosis not present

## 2019-11-29 DIAGNOSIS — O039 Complete or unspecified spontaneous abortion without complication: Secondary | ICD-10-CM | POA: Diagnosis not present

## 2019-12-01 DIAGNOSIS — O039 Complete or unspecified spontaneous abortion without complication: Secondary | ICD-10-CM | POA: Diagnosis not present

## 2019-12-29 DIAGNOSIS — R928 Other abnormal and inconclusive findings on diagnostic imaging of breast: Secondary | ICD-10-CM | POA: Diagnosis not present

## 2019-12-29 DIAGNOSIS — Z08 Encounter for follow-up examination after completed treatment for malignant neoplasm: Secondary | ICD-10-CM | POA: Diagnosis not present

## 2019-12-29 DIAGNOSIS — Z86000 Personal history of in-situ neoplasm of breast: Secondary | ICD-10-CM | POA: Diagnosis not present

## 2019-12-29 DIAGNOSIS — Z923 Personal history of irradiation: Secondary | ICD-10-CM | POA: Diagnosis not present

## 2019-12-29 DIAGNOSIS — Z853 Personal history of malignant neoplasm of breast: Secondary | ICD-10-CM | POA: Diagnosis not present

## 2019-12-29 DIAGNOSIS — Z9012 Acquired absence of left breast and nipple: Secondary | ICD-10-CM | POA: Diagnosis not present

## 2019-12-29 LAB — HM MAMMOGRAPHY

## 2020-02-03 ENCOUNTER — Encounter: Payer: BC Managed Care – PPO | Admitting: Family

## 2020-02-06 ENCOUNTER — Encounter: Payer: BC Managed Care – PPO | Admitting: Family

## 2020-02-08 ENCOUNTER — Ambulatory Visit (INDEPENDENT_AMBULATORY_CARE_PROVIDER_SITE_OTHER): Payer: BC Managed Care – PPO | Admitting: Family

## 2020-02-08 ENCOUNTER — Encounter: Payer: Self-pay | Admitting: Family

## 2020-02-08 ENCOUNTER — Other Ambulatory Visit: Payer: Self-pay

## 2020-02-08 VITALS — BP 106/64 | HR 66 | Temp 98.2°F | Resp 16 | Ht 64.0 in | Wt 260.0 lb

## 2020-02-08 DIAGNOSIS — Z1159 Encounter for screening for other viral diseases: Secondary | ICD-10-CM | POA: Diagnosis not present

## 2020-02-08 DIAGNOSIS — D509 Iron deficiency anemia, unspecified: Secondary | ICD-10-CM | POA: Diagnosis not present

## 2020-02-08 DIAGNOSIS — E785 Hyperlipidemia, unspecified: Secondary | ICD-10-CM | POA: Diagnosis not present

## 2020-02-08 DIAGNOSIS — Z114 Encounter for screening for human immunodeficiency virus [HIV]: Secondary | ICD-10-CM

## 2020-02-08 DIAGNOSIS — E1169 Type 2 diabetes mellitus with other specified complication: Secondary | ICD-10-CM | POA: Diagnosis not present

## 2020-02-08 DIAGNOSIS — Z23 Encounter for immunization: Secondary | ICD-10-CM | POA: Diagnosis not present

## 2020-02-08 DIAGNOSIS — Z0000 Encounter for general adult medical examination without abnormal findings: Secondary | ICD-10-CM

## 2020-02-08 NOTE — Patient Instructions (Addendum)
Please complete lab work prior to leaving. Continue your work on healthy diet and try to add 30 minutes of walking 5 days a week.    Preventive Care 32-46 Years Old, Female Preventive care refers to visits with your health care provider and lifestyle choices that can promote health and wellness. This includes:  A yearly physical exam. This may also be called an annual well check.  Regular dental visits and eye exams.  Immunizations.  Screening for certain conditions.  Healthy lifestyle choices, such as eating a healthy diet, getting regular exercise, not using drugs or products that contain nicotine and tobacco, and limiting alcohol use. What can I expect for my preventive care visit? Physical exam Your health care provider will check your:  Height and weight. This may be used to calculate body mass index (BMI), which tells if you are at a healthy weight.  Heart rate and blood pressure.  Skin for abnormal spots. Counseling Your health care provider may ask you questions about your:  Alcohol, tobacco, and drug use.  Emotional well-being.  Home and relationship well-being.  Sexual activity.  Eating habits.  Work and work Statistician.  Method of birth control.  Menstrual cycle.  Pregnancy history. What immunizations do I need?  Influenza (flu) vaccine  This is recommended every year. Tetanus, diphtheria, and pertussis (Tdap) vaccine  You may need a Td booster every 10 years. Varicella (chickenpox) vaccine  You may need this if you have not been vaccinated. Zoster (shingles) vaccine  You may need this after age 98. Measles, mumps, and rubella (MMR) vaccine  You may need at least one dose of MMR if you were born in 1957 or later. You may also need a second dose. Pneumococcal conjugate (PCV13) vaccine  You may need this if you have certain conditions and were not previously vaccinated. Pneumococcal polysaccharide (PPSV23) vaccine  You may need one or two  doses if you smoke cigarettes or if you have certain conditions. Meningococcal conjugate (MenACWY) vaccine  You may need this if you have certain conditions. Hepatitis A vaccine  You may need this if you have certain conditions or if you travel or work in places where you may be exposed to hepatitis A. Hepatitis B vaccine  You may need this if you have certain conditions or if you travel or work in places where you may be exposed to hepatitis B. Haemophilus influenzae type b (Hib) vaccine  You may need this if you have certain conditions. Human papillomavirus (HPV) vaccine  If recommended by your health care provider, you may need three doses over 6 months. You may receive vaccines as individual doses or as more than one vaccine together in one shot (combination vaccines). Talk with your health care provider about the risks and benefits of combination vaccines. What tests do I need? Blood tests  Lipid and cholesterol levels. These may be checked every 5 years, or more frequently if you are over 70 years old.  Hepatitis C test.  Hepatitis B test. Screening  Lung cancer screening. You may have this screening every year starting at age 83 if you have a 30-pack-year history of smoking and currently smoke or have quit within the past 15 years.  Colorectal cancer screening. All adults should have this screening starting at age 38 and continuing until age 51. Your health care provider may recommend screening at age 21 if you are at increased risk. You will have tests every 1-10 years, depending on your results and the type  of screening test.  Diabetes screening. This is done by checking your blood sugar (glucose) after you have not eaten for a while (fasting). You may have this done every 1-3 years.  Mammogram. This may be done every 1-2 years. Talk with your health care provider about when you should start having regular mammograms. This may depend on whether you have a family history of  breast cancer.  BRCA-related cancer screening. This may be done if you have a family history of breast, ovarian, tubal, or peritoneal cancers.  Pelvic exam and Pap test. This may be done every 3 years starting at age 58. Starting at age 51, this may be done every 5 years if you have a Pap test in combination with an HPV test. Other tests  Sexually transmitted disease (STD) testing.  Bone density scan. This is done to screen for osteoporosis. You may have this scan if you are at high risk for osteoporosis. Follow these instructions at home: Eating and drinking  Eat a diet that includes fresh fruits and vegetables, whole grains, lean protein, and low-fat dairy.  Take vitamin and mineral supplements as recommended by your health care provider.  Do not drink alcohol if: ? Your health care provider tells you not to drink. ? You are pregnant, may be pregnant, or are planning to become pregnant.  If you drink alcohol: ? Limit how much you have to 0-1 drink a day. ? Be aware of how much alcohol is in your drink. In the [place] one drink equals one 12 oz bottle of beer (355 mL), one 5 oz glass of wine (148 mL), or one 1 oz glass of hard liquor (44 mL). Lifestyle  Take daily care of your teeth and gums.  Stay active. Exercise for at least 30 minutes on 5 or more days each week.  Do not use any products that contain nicotine or tobacco, such as cigarettes, e-cigarettes, and chewing tobacco. If you need help quitting, ask your health care provider.  If you are sexually active, practice safe sex. Use a condom or other form of birth control (contraception) in order to prevent pregnancy and STIs (sexually transmitted infections).  If told by your health care provider, take low-dose aspirin daily starting at age 51. What's next?  Visit your health care provider once a year for a well check visit.  Ask your health care provider how often you should have your eyes and teeth checked.  Stay up to  date on all vaccines. This information is not intended to replace advice given to you by your health care provider. Make sure you discuss any questions you have with your health care provider. Document Revised: 12/24/2017 Document Reviewed: 12/24/2017 Elsevier Patient Education  2020 [employer].

## 2020-02-08 NOTE — Progress Notes (Signed)
Subjective:    Patient ID: Elizabeth Dixon, female    DOB: 1975/07/14, 44 y.o.   MRN: [id number]  HPI  Patient is a 44 yr old female who presents today for annual physical.  Immunizations: Tdap 2010- had covid vaccine and flu Diet: healthy (Keto) Wt Readings from Last 3 Encounters:  02/08/20 260 lb (117.9 kg)  11/18/19 (!) 258 lb (117 kg)  03/15/19 285 lb (129.3 kg)  Exercise: no formal exercise Pap Smear: 02/01/2019 Mammogram: 12/2019 Vision: due Dental: up to date  Last visit she had a positive pregnancy test.  She miscarried the pregnancy at 7 weeks.    Review of Systems  Constitutional: Negative for unexpected weight change.  HENT: Negative for hearing loss and rhinorrhea.   Eyes: Negative for visual disturbance.  Respiratory: Negative for cough and shortness of breath.   Cardiovascular: Negative for chest pain.  Gastrointestinal: Positive for blood in stool (reports hemorroid related). Negative for constipation and diarrhea.  Genitourinary: Negative for dysuria, frequency and hematuria.  Musculoskeletal: Negative for arthralgias and myalgias.  Skin: Negative for rash.  Neurological: Negative for headaches.  Hematological: Negative for adenopathy.  Psychiatric/Behavioral:       Denies depression/anxiety   Past Medical History:  Diagnosis Date  . Breast cancer ([place])    left breast  . Multiple sclerosis ([place])      Social History   Socioeconomic History  . Marital status: Married    Spouse name: Not on file  . Number of children: Not on file  . Years of education: Not on file  . Highest education level: Not on file  Occupational History  . 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  Tobacco Use  . Smoking status: Former Smoker    Types: Cigarettes    Quit date: 2006    Years since quitting: 15.7  . Smokeless tobacco: Never Used  Vaping Use  . Vaping Use: Never used  Substance and Sexual Activity  . Alcohol use: Yes    Alcohol/week: 1.0 - 2.0 standard drink    Types: 1 - 2  Shots of liquor per week    Comment: once a wk  . Drug use: Never  . Sexual activity: Yes    Partners: Male  Other Topics Concern  . Not on file  Social History Narrative   Works as an Geologist, engineering   Married-  [place] 2013 son and 2011 daughter   Dorie Rank from [place]   Enjoys children's sports   Enjoys outside activities   dog   Complete bachelors degree      Right handed   Social Determinants of Health   Financial Resource Strain:   . Difficulty of Paying Living Expenses: Not on file  Food Insecurity:   . Worried About Charity fundraiser in the Last Year: Not on file  . Ran Out of Food in the Last Year: Not on file  Transportation Needs:   . Lack of Transportation (Medical): Not on file  . Lack of Transportation (Non-Medical): Not on file  Physical Activity:   . Days of Exercise per Week: Not on file  . Minutes of Exercise per Session: Not on file  Stress:   . Feeling of Stress : Not on file  Social Connections:   . Frequency of Communication with Friends and Family: Not on file  . Frequency of Social Gatherings with Friends and Family: Not on file  . Attends Religious Services: Not on file  . Active Member of Clubs or Organizations: Not  on file  . Attends Archivist Meetings: Not on file  . Marital Status: Not on file  Intimate Partner Violence:   . Fear of Current or Ex-Partner: Not on file  . Emotionally Abused: Not on file  . Physically Abused: Not on file  . Sexually Abused: Not on file    Past Surgical History:  Procedure Laterality Date  . BREAST LUMPECTOMY Left 08/2014  . BREAST REDUCTION SURGERY Bilateral 08/2014  . CESAREAN SECTION  2013    Family History  Problem Relation Age of Onset  . Thyroid disease Mother   . Hypertension Father   . Prostate cancer Father     No Known Allergies  Current Outpatient Medications on File Prior to Visit  Medication Sig Dispense Refill  . ferrous sulfate 325 (65 FE) MG tablet Take 1 tablet (325 mg total)  by mouth daily with breakfast.  3  . Multiple Vitamins-Minerals (VITAMIN D3 COMPLETE PO) Take by mouth.     No current facility-administered medications on file prior to visit.    BP 106/64 (BP Location: Right Arm, Patient Position: Sitting, Cuff Size: Large)   Pulse 66   Temp 98.2 F (36.8 C) (Oral)   Resp 16   Ht 5\' 4"  (1.626 m)   Wt 260 lb (117.9 kg)   SpO2 100%   BMI 44.63 kg/m        Objective:   Physical Exam  Physical Exam  Constitutional: She is oriented to person, place, and time. She appears well-developed and well-nourished. No distress.  HENT:  Head: Normocephalic and atraumatic.  Right Ear: Tympanic membrane and ear canal normal.  Left Ear: Tympanic membrane and ear canal normal.  Mouth/Throat:not examined- pt wearing mask Eyes: Pupils are equal, round, and reactive to light. No scleral icterus.  Neck: Normal range of motion. No thyromegaly present.  Cardiovascular: Normal rate and regular rhythm.   No murmur heard. Pulmonary/Chest: Effort normal and breath sounds normal. No respiratory distress. He has no wheezes. She has no rales. She exhibits no tenderness.  Abdominal: Soft. Bowel sounds are normal. She exhibits no distension and no mass. There is no tenderness. There is no rebound and no guarding.  Musculoskeletal: She exhibits no edema.  Lymphadenopathy:    She has no cervical adenopathy.  Neurological: She is alert and oriented to person, place, and time.She exhibits normal muscle tone. Coordination normal.  Skin: Skin is warm and dry.  Psychiatric: She has a normal mood and affect. Her behavior is normal. Judgment and thought content normal.  Breast/pelvic: deferred         Assessment & Plan:   Preventative care- flu shot and Td today.  She is up to date on covid vaccination.  Will obtain labs as ordered.  Mammo/pap up to date.  Discussed healthy diet, exercise and weight loss.   This visit occurred during the SARS-CoV-2 public health emergency.   Safety protocols were in place, including screening questions prior to the visit, additional usage of staff PPE, and extensive cleaning of exam room while observing appropriate contact time as indicated for disinfecting solutions.          Assessment & Plan:

## 2020-02-09 LAB — COMPREHENSIVE METABOLIC PANEL
AG Ratio: 1.2 (calc) (ref 1.0–2.5)
ALT: 14 U/L (ref 6–29)
AST: 12 U/L (ref 10–35)
Albumin: 3.6 g/dL (ref 3.6–5.1)
Alkaline phosphatase (APISO): 71 U/L (ref 31–125)
BUN: 16 mg/dL (ref 7–25)
CO2: 26 mmol/L (ref 20–32)
Calcium: 8.9 mg/dL (ref 8.6–10.2)
Chloride: 103 mmol/L (ref 98–110)
Creat: 0.61 mg/dL (ref 0.50–1.10)
Globulin: 2.9 g/dL (calc) (ref 1.9–3.7)
Glucose, Bld: 88 mg/dL (ref 65–99)
Potassium: 4.4 mmol/L (ref 3.5–5.3)
Sodium: 138 mmol/L (ref 135–146)
Total Bilirubin: 0.4 mg/dL (ref 0.2–1.2)
Total Protein: 6.5 g/dL (ref 6.1–8.1)

## 2020-02-09 LAB — CBC WITH DIFFERENTIAL/PLATELET
Absolute Monocytes: 551 cells/uL (ref 200–950)
Basophils Absolute: 41 cells/uL (ref 0–200)
Basophils Relative: 0.7 %
Eosinophils Absolute: 162 cells/uL (ref 15–500)
Eosinophils Relative: 2.8 %
HCT: 41.7 % (ref 35.0–45.0)
Hemoglobin: 13.8 g/dL (ref 11.7–15.5)
Lymphs Abs: 1607 cells/uL (ref 850–3900)
MCH: 29.4 pg (ref 27.0–33.0)
MCHC: 33.1 g/dL (ref 32.0–36.0)
MCV: 88.7 fL (ref 80.0–100.0)
MPV: 10.1 fL (ref 7.5–12.5)
Monocytes Relative: 9.5 %
Neutro Abs: 3439 cells/uL (ref 1500–7800)
Neutrophils Relative %: 59.3 %
Platelets: 283 10*3/uL (ref 140–400)
RBC: 4.7 10*6/uL (ref 3.80–5.10)
RDW: 12.6 % (ref 11.0–15.0)
Total Lymphocyte: 27.7 %
WBC: 5.8 10*3/uL (ref 3.8–10.8)

## 2020-02-09 LAB — IRON: Iron: 103 ug/dL (ref 40–190)

## 2020-02-09 LAB — LIPID PANEL
Cholesterol: 221 mg/dL — ABNORMAL HIGH (ref <200)
HDL: 71 mg/dL (ref >=50)
LDL Cholesterol (Calc): 132 mg/dL (calc) — ABNORMAL HIGH
Non-HDL Cholesterol (Calc): 150 mg/dL (calc) — ABNORMAL HIGH (ref <130)
Total CHOL/HDL Ratio: 3.1 (calc) (ref <5.0)
Triglycerides: 82 mg/dL (ref <150)

## 2020-02-09 LAB — HEPATITIS C ANTIBODY
Hepatitis C Ab: NONREACTIVE
SIGNAL TO CUT-OFF: 0.03

## 2020-02-09 LAB — HIV ANTIBODY (ROUTINE TESTING W REFLEX): HIV 1&2 Ab, 4th Generation: NONREACTIVE

## 2020-02-28 ENCOUNTER — Encounter: Payer: Self-pay | Admitting: Internal Medicine

## 2020-04-09 ENCOUNTER — Other Ambulatory Visit: Payer: Self-pay

## 2020-04-09 ENCOUNTER — Ambulatory Visit (AMBULATORY_SURGERY_CENTER): Payer: Self-pay

## 2020-04-09 VITALS — Ht 64.0 in | Wt 271.0 lb

## 2020-04-09 DIAGNOSIS — Z1211 Encounter for screening for malignant neoplasm of colon: Secondary | ICD-10-CM

## 2020-04-09 NOTE — Progress Notes (Signed)
No egg or soy allergy known to patient  No issues with past sedation with any surgeries or procedures No intubation problems in the past  No FH of Malignant Hyperthermia No diet pills per patient No home 02 use per patient  No blood thinners per patient  Pt denies issues with constipation  No A fib or A flutter  EMMI video via [facility] 19 guidelines implemented in PV today with Pt and RN  Coupon given to pt in PV today , Code to Pharmacy  COVID vaccines completed on 06/2019 per pt;  Due to the COVID-19 pandemic we are asking patients to follow certain guidelines.  Pt aware of COVID protocols and [facility] guidelines

## 2020-04-12 ENCOUNTER — Encounter: Payer: Self-pay | Admitting: Internal Medicine

## 2020-04-23 ENCOUNTER — Encounter: Payer: Self-pay | Admitting: Internal Medicine

## 2020-04-23 ENCOUNTER — Other Ambulatory Visit: Payer: Self-pay

## 2020-04-23 ENCOUNTER — Ambulatory Visit (AMBULATORY_SURGERY_CENTER): Payer: BC Managed Care – PPO | Admitting: Internal Medicine

## 2020-04-23 VITALS — BP 92/62 | HR 70 | Temp 98.2°F | Resp 15 | Ht 64.0 in | Wt 271.0 lb

## 2020-04-23 DIAGNOSIS — Z1211 Encounter for screening for malignant neoplasm of colon: Secondary | ICD-10-CM

## 2020-04-23 DIAGNOSIS — D125 Benign neoplasm of sigmoid colon: Secondary | ICD-10-CM

## 2020-04-23 DIAGNOSIS — C187 Malignant neoplasm of sigmoid colon: Secondary | ICD-10-CM | POA: Insufficient documentation

## 2020-04-23 MED ORDER — SODIUM CHLORIDE 0.9 % IV SOLN: 500.0000 mL | Freq: Once | INTRAVENOUS | Status: DC

## 2020-04-23 NOTE — Progress Notes (Signed)
Called to room to assist during endoscopic procedure.  Patient ID and intended procedure confirmed with present staff. Received instructions for my participation in the procedure from the performing physician.  

## 2020-04-23 NOTE — Op Note (Signed)
[facility] Patient Name: Elizabeth Dixon Procedure Date: 04/23/2020 2:29 PM MRN: [id number] Endoscopist: Gatha Mayer , MD Age: 44 Referring MD:  Date of Birth: May 19, 1975 Gender: Female Account #: [id number] Procedure:                Colonoscopy Indications:              Screening for colorectal malignant neoplasm, This                            is the patient's first colonoscopy Medicines:                Propofol per Anesthesia, Monitored Anesthesia Care Procedure:                Pre-Anesthesia Assessment:                           - Prior to the procedure, a History and Physical                            was performed, and patient medications and                            allergies were reviewed. The patient's tolerance of                            previous anesthesia was also reviewed. The risks                            and benefits of the procedure and the sedation                            options and risks were discussed with the patient.                            All questions were answered, and informed consent                            was obtained. Prior Anticoagulants: The patient has                            taken no previous anticoagulant or antiplatelet                            agents. ASA Grade Assessment: III - A patient with                            severe systemic disease. After reviewing the risks                            and benefits, the patient was deemed in                            satisfactory condition to undergo the procedure.  After obtaining informed consent, the colonoscope                            was passed under direct vision. Throughout the                            procedure, the patient's blood pressure, pulse, and                            oxygen saturations were monitored continuously. The                            Olympus CF-HQ190L [id number]) Colonoscope was                             introduced through the anus and advanced to the the                            cecum, identified by appendiceal orifice and                            ileocecal valve. The colonoscopy was performed                            without difficulty. The patient tolerated the                            procedure well. The quality of the bowel                            preparation was good. The ileocecal valve,                            appendiceal orifice, and rectum were photographed.                            The bowel preparation used was Miralax via split                            dose instruction. Scope In: 2:44:48 PM Scope Out: 3:05:27 PM Scope Withdrawal Time: 0 hours 17 minutes 49 seconds  Total Procedure Duration: 0 hours 20 minutes 39 seconds  Findings:                 The perianal and digital rectal examinations were                            normal.                           A 15 mm polyp was found in the proximal sigmoid                            colon. The polyp was pedunculated. The polyp was  removed with a hot snare. Resection and retrieval                            were complete. Verification of patient                            identification for the specimen was done.                           A 30 mm polyp was found in the distal sigmoid                            colon. The polyp was sessile. Biopsies were taken                            with a cold forceps for histology. Verification of                            patient identification for the specimen was done.                            Estimated blood loss was minimal. Area was tattooed                            with an injection of 4 mL of Spot (carbon black).                            Estimated blood loss: none.                           Multiple small-mouthed diverticula were found in                            the sigmoid colon.                           The exam was otherwise  without abnormality on                            direct and retroflexion views. Complications:            No immediate complications. Estimated Blood Loss:     Estimated blood loss was minimal. Impression:               - One 15 mm polyp in the proximal sigmoid colon,                            removed with a hot snare. Resected and retrieved.                            The stalk was ligated with an endoloop prior to                            polypectomy                           -  One 30 mm polyp in the distal sigmoid colon.                            Biopsied. Tattooed. Central depression ? ulcer - if                            biopsies benign will recommend attempt at                            polypectomy atr hospital                           - Diverticulosis in the sigmoid colon.                           - The examination was otherwise normal on direct                            and retroflexion views. Recommendation:           - Patient has a contact number available for                            emergencies. The signs and symptoms of potential                            delayed complications were discussed with the                            patient. Return to normal activities tomorrow.                            Written discharge instructions were provided to the                            patient.                           - Resume previous diet.                           - Continue present medications.                           - No aspirin, ibuprofen, naproxen, or other                            non-steroidal anti-inflammatory drugs for 2 weeks                            after polyp removal. Gatha Mayer, MD 04/23/2020 3:22:34 PM This report has been signed electronically.

## 2020-04-23 NOTE — Patient Instructions (Addendum)
I found 2 large polyps.  I was able to remove one but the other is very large and though it might be able to be removed with the scope I am not certain about that yet.  I took biopsies to understand it better and I will contact you with recommendations and plans.  You also have some diverticulosis which is common and not typically a problem.  I appreciate the opportunity to care for you. Stan Head, MD, [facility]   Handouts given for polyps and diverticulosis.  No aspirin, ibuprofen, naproxen or other NSAIDS for 2 weeks after polyp removal.  You may take tylenol if needed.  YOU HAD AN ENDOSCOPIC PROCEDURE TODAY AT THE [facility]:   Refer to the procedure report that was given to you for any specific questions about what was found during the examination.  If the procedure report does not answer your questions, please call your gastroenterologist to clarify.  If you requested that your care partner not be given the details of your procedure findings, then the procedure report has been included in a sealed envelope for you to review at your convenience later.  YOU SHOULD EXPECT: Some feelings of bloating in the abdomen. Passage of more gas than usual.  Walking can help get rid of the air that was put into your GI tract during the procedure and reduce the bloating. If you had a lower endoscopy (such as a colonoscopy or flexible sigmoidoscopy) you may notice spotting of blood in your stool or on the toilet paper. If you underwent a bowel prep for your procedure, you may not have a normal bowel movement for a few days.  Please Note:  You might notice some irritation and congestion in your nose or some drainage.  This is from the oxygen used during your procedure.  There is no need for concern and it should clear up in a day or so.  SYMPTOMS TO REPORT IMMEDIATELY:   Following lower endoscopy (colonoscopy or flexible sigmoidoscopy):  Excessive amounts of blood in the stool  Significant  tenderness or worsening of abdominal pains  Swelling of the abdomen that is new, acute  Fever of 100F or higher  For urgent or emergent issues, a gastroenterologist can be reached at any hour by calling [phone]. Do not use MyChart messaging for urgent concerns.    DIET:  We do recommend a small meal at first, but then you may proceed to your regular diet.  Drink plenty of fluids but you should avoid alcoholic beverages for 24 hours.  ACTIVITY:  You should plan to take it easy for the rest of today and you should NOT DRIVE or use heavy machinery until tomorrow (because of the sedation medicines used during the test).    FOLLOW UP: Our staff will call the number listed on your records 48-72 hours following your procedure to check on you and address any questions or concerns that you may have regarding the information given to you following your procedure. If we do not reach you, we will leave a message.  We will attempt to reach you two times.  During this call, we will ask if you have developed any symptoms of COVID 19. If you develop any symptoms (ie: fever, flu-like symptoms, shortness of breath, cough etc.) before then, please call [phone].  If you test positive for Covid 19 in the 2 weeks post procedure, please call and report this information to [place].    If any biopsies were taken  you will be contacted by phone or by letter within the next 1-3 weeks.  Please call us at [phone] if you have not heard about the biopsies in 3 weeks.    SIGNATURES/CONFIDENTIALITY: You and/or your care partner have signed paperwork which will be entered into your electronic medical record.  These signatures attest to the fact that that the information above on your After Visit Summary has been reviewed and is understood.  Full responsibility of the confidentiality of this discharge information lies with you and/or your care-partner.

## 2020-04-23 NOTE — Progress Notes (Signed)
Report to PACU, RN, vss, BBS= Clear.  

## 2020-04-23 NOTE — Progress Notes (Signed)
Pt's states no medical or surgical changes since previsit or office visit. 

## 2020-04-24 ENCOUNTER — Telehealth: Payer: Self-pay | Admitting: *Deleted

## 2020-04-24 NOTE — Telephone Encounter (Signed)
  Follow up Call-  Call back number 04/23/2020  Post procedure Call Back phone  # [phone]  Permission to leave phone message Yes     Patient questions:  Do you have a fever, pain , or abdominal swelling? No. Pain Score  0 *  Have you tolerated food without any problems? Yes.    Have you been able to return to your normal activities? Yes.    Do you have any questions about your discharge instructions: Diet   No. Medications  No. Follow up visit  No.  Do you have questions or concerns about your Care? No.  Actions: * If pain score is 4 or above: 1. No action needed, pain <4.Have you developed a fever since your procedure? no  2.   Have you had an respiratory symptoms (SOB or cough) since your procedure? no  3.   Have you tested positive for COVID 19 since your procedure no  4.   Have you had any family members/close contacts diagnosed with the COVID 19 since your procedure?  no   If yes to any of these questions please route to Laverna Peace, RN and Karlton Lemon, RN

## 2020-05-01 ENCOUNTER — Telehealth: Payer: Self-pay | Admitting: Internal Medicine

## 2020-05-01 NOTE — Telephone Encounter (Signed)
Pathology office called for results please call [phone].

## 2020-05-02 ENCOUNTER — Telehealth: Payer: Self-pay | Admitting: Gastroenterology

## 2020-05-02 DIAGNOSIS — C187 Malignant neoplasm of sigmoid colon: Secondary | ICD-10-CM

## 2020-05-02 NOTE — Telephone Encounter (Signed)
Hi Dr. Myrtie Neither,  This patient states she is returning your missed call to discuss her pathology results. Se is a patient of Dr. Leone Payor.  Patient states she is at work.  Patient is requesting before 5:00pm, please call [phone]. After 5:00pm to please call her cell-phone at [phone]..  Thank you

## 2020-05-02 NOTE — Telephone Encounter (Signed)
I spoke with Elizabeth Dixon by phone and gave her the biopsy results showing colon cancer,( actually she was already aware because the results have been released through the portal.)  I explained the next steps of staging CT scans, lab work and a referral to surgery to plan resection.  She was grateful for the information, all questions were answered and she is ready to proceed.  Please arrange the following, preferably within the next week:  CBC, CMP, CEA  CT scan chest, abdomen and pelvis with oral and IV contrast.  Indication is colon cancer  Referral to [facility]  - H. Myrtie Neither, MD  ______________  [name] on your patient  ___________________  [name] on your patient.  Great job sending her for a screening colonoscopy.

## 2020-05-02 NOTE — Telephone Encounter (Signed)
Thanks - I was just catching up on results and will also message her.  I had explained I thought this lesion could be cancer.  Elizabeth Dixon,  Please refer to Drs Cliffton Asters or Maisie Fus

## 2020-05-03 NOTE — Telephone Encounter (Signed)
CT scheduled at [facility] for 05/07/20 8:00 am.    New instructions and contrast put out front.  New labs entered She is notified of the referral to [facility] and that she will be contacted directly by them with an appt date and time.  She will come for labs and to pick up her contrast and instructions today or tomorrow.

## 2020-05-04 ENCOUNTER — Other Ambulatory Visit (INDEPENDENT_AMBULATORY_CARE_PROVIDER_SITE_OTHER): Payer: BC Managed Care – PPO

## 2020-05-04 DIAGNOSIS — C187 Malignant neoplasm of sigmoid colon: Secondary | ICD-10-CM

## 2020-05-04 LAB — CBC WITH DIFFERENTIAL/PLATELET
Basophils Absolute: 0 10*3/uL (ref 0.0–0.1)
Basophils Relative: 0.8 % (ref 0.0–3.0)
Eosinophils Absolute: 0.1 10*3/uL (ref 0.0–0.7)
Eosinophils Relative: 2.5 % (ref 0.0–5.0)
HCT: 39.7 % (ref 36.0–46.0)
Hemoglobin: 13.4 g/dL (ref 12.0–15.0)
Lymphocytes Relative: 33.3 % (ref 12.0–46.0)
Lymphs Abs: 1.8 10*3/uL (ref 0.7–4.0)
MCHC: 33.7 g/dL (ref 30.0–36.0)
MCV: 87.2 fl (ref 78.0–100.0)
Monocytes Absolute: 0.7 10*3/uL (ref 0.1–1.0)
Monocytes Relative: 12.4 % — ABNORMAL HIGH (ref 3.0–12.0)
Neutro Abs: 2.8 10*3/uL (ref 1.4–7.7)
Neutrophils Relative %: 51 % (ref 43.0–77.0)
Platelets: 251 10*3/uL (ref 150.0–400.0)
RBC: 4.55 Mil/uL (ref 3.87–5.11)
RDW: 13.6 % (ref 11.5–15.5)
WBC: 5.5 10*3/uL (ref 4.0–10.5)

## 2020-05-04 LAB — COMPREHENSIVE METABOLIC PANEL
ALT: 16 U/L (ref 0–35)
AST: 18 U/L (ref 0–37)
Albumin: 4 g/dL (ref 3.5–5.2)
Alkaline Phosphatase: 73 U/L (ref 39–117)
BUN: 14 mg/dL (ref 6–23)
CO2: 30 mEq/L (ref 19–32)
Calcium: 8.9 mg/dL (ref 8.4–10.5)
Chloride: 101 mEq/L (ref 96–112)
Creatinine, Ser: 0.63 mg/dL (ref 0.40–1.20)
GFR: 106.66 mL/min (ref >60.00)
Glucose, Bld: 101 mg/dL — ABNORMAL HIGH (ref 70–99)
Potassium: 3.4 mEq/L — ABNORMAL LOW (ref 3.5–5.1)
Sodium: 137 mEq/L (ref 135–145)
Total Bilirubin: 0.3 mg/dL (ref 0.2–1.2)
Total Protein: 7.3 g/dL (ref 6.0–8.3)

## 2020-05-05 LAB — CEA: CEA: <0.5 ng/mL

## 2020-05-07 ENCOUNTER — Telehealth: Payer: Self-pay

## 2020-05-07 ENCOUNTER — Other Ambulatory Visit: Payer: Self-pay

## 2020-05-07 ENCOUNTER — Ambulatory Visit (HOSPITAL_BASED_OUTPATIENT_CLINIC_OR_DEPARTMENT_OTHER)
Admission: RE | Admit: 2020-05-07 | Discharge: 2020-05-07 | Disposition: A | Discharge status: Home / Self Care | Payer: BC Managed Care – PPO | Source: Ambulatory Visit | Attending: Internal Medicine | Admitting: Internal Medicine

## 2020-05-07 ENCOUNTER — Encounter (HOSPITAL_BASED_OUTPATIENT_CLINIC_OR_DEPARTMENT_OTHER): Payer: Self-pay

## 2020-05-07 ENCOUNTER — Telehealth: Payer: Self-pay | Admitting: Internal Medicine

## 2020-05-07 DIAGNOSIS — R932 Abnormal findings on diagnostic imaging of liver and biliary tract: Secondary | ICD-10-CM

## 2020-05-07 DIAGNOSIS — K8020 Calculus of gallbladder without cholecystitis without obstruction: Secondary | ICD-10-CM | POA: Diagnosis not present

## 2020-05-07 DIAGNOSIS — C187 Malignant neoplasm of sigmoid colon: Secondary | ICD-10-CM | POA: Insufficient documentation

## 2020-05-07 DIAGNOSIS — R918 Other nonspecific abnormal finding of lung field: Secondary | ICD-10-CM | POA: Diagnosis not present

## 2020-05-07 DIAGNOSIS — K6389 Other specified diseases of intestine: Secondary | ICD-10-CM | POA: Diagnosis not present

## 2020-05-07 DIAGNOSIS — C189 Malignant neoplasm of colon, unspecified: Secondary | ICD-10-CM | POA: Diagnosis not present

## 2020-05-07 DIAGNOSIS — N200 Calculus of kidney: Secondary | ICD-10-CM | POA: Diagnosis not present

## 2020-05-07 DIAGNOSIS — R911 Solitary pulmonary nodule: Secondary | ICD-10-CM | POA: Diagnosis not present

## 2020-05-07 IMAGING — CT CT CHEST W/ CM
2 of 5 series · 12 of 36 positions shown, 15 images · IV contrast (omnipaque)
Comparison: None.

CLINICAL DATA: Colon cancer.  Staging.

EXAM:
CT CHEST, ABDOMEN, AND PELVIS WITH CONTRAST
TECHNIQUE: Multidetector CT imaging of the chest, abdomen and pelvis was
performed following the standard protocol during bolus
administration of intravenous contrast.
CONTRAST:  100mL OMNIPAQUE IOHEXOL 300 MG/ML  SOLN

[Series 2: cap with 2 · axial · 0.96mm/px · z∈[-466,+64]mm · 9 of 134 slices shown, 12 images]
[im 14/134  mediastinal]
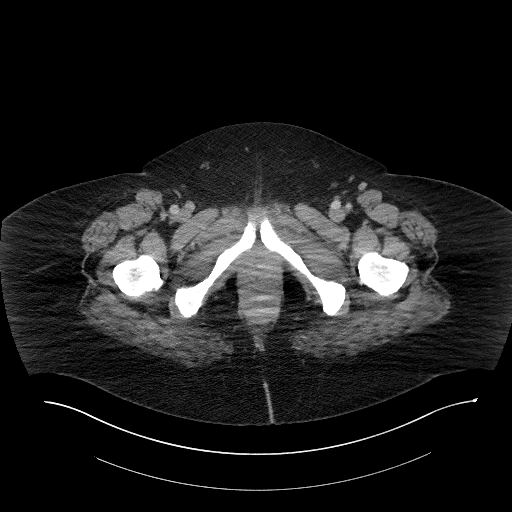
[im 14/134  lung]
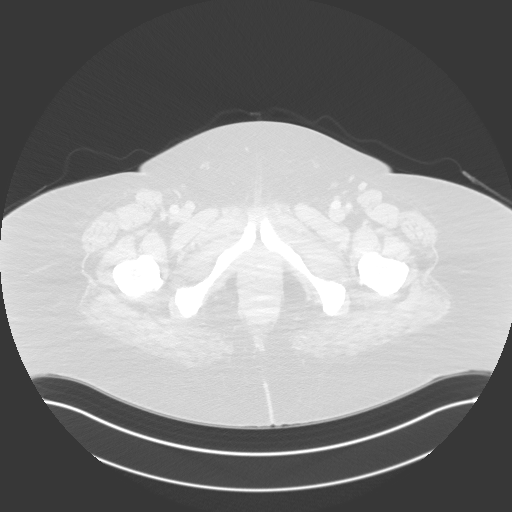
[im 27/134  lung]
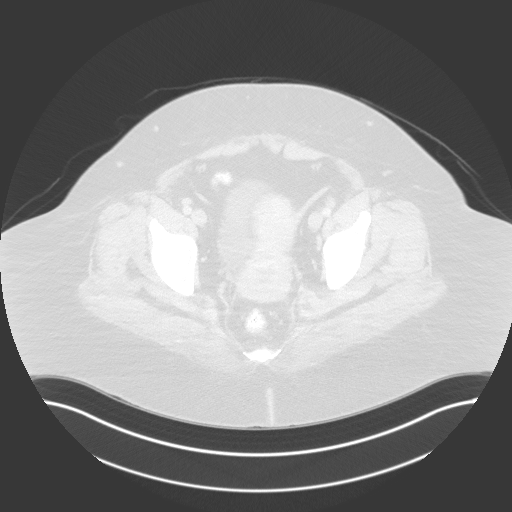
[im 40/134  lung]
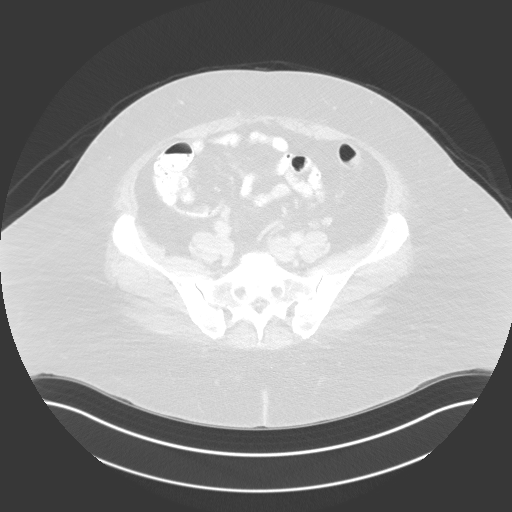
[im 54/134  lung]
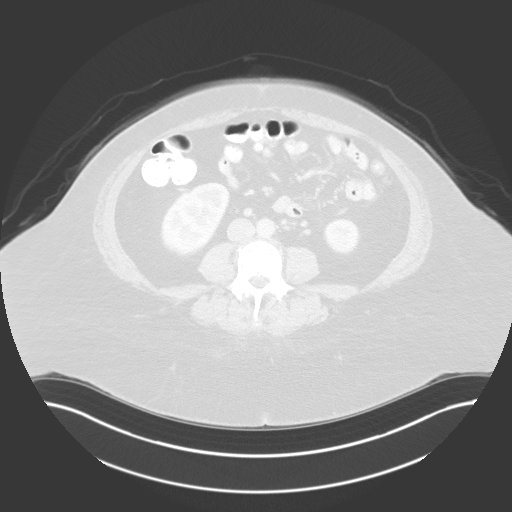
[im 67/134  mediastinal]
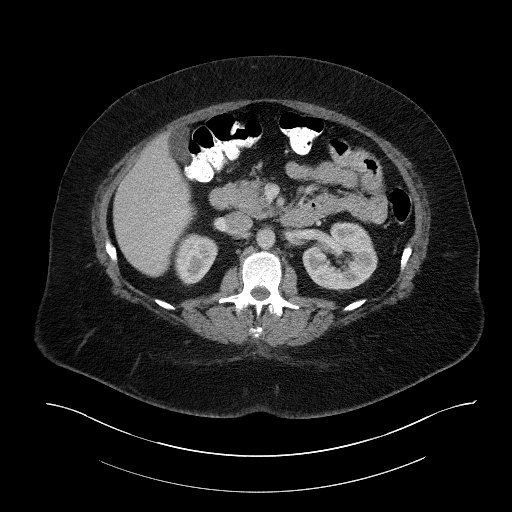
[im 67/134  lung]
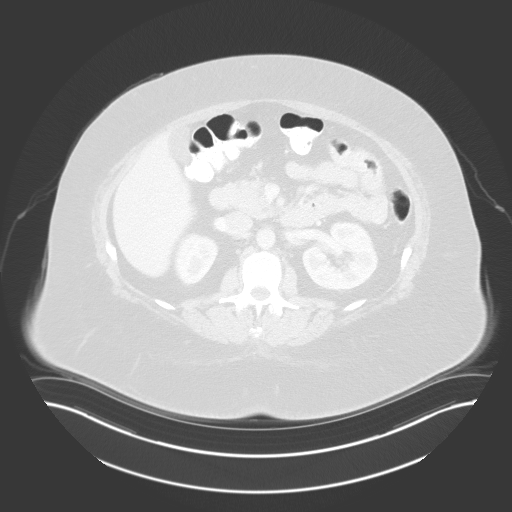
[im 80/134  lung]
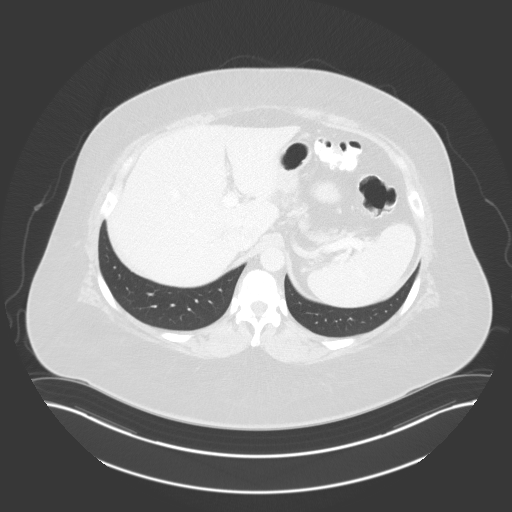
[im 94/134  lung]
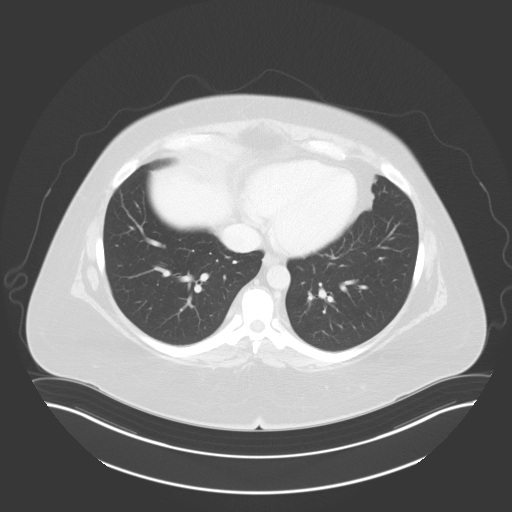
[im 107/134  lung]
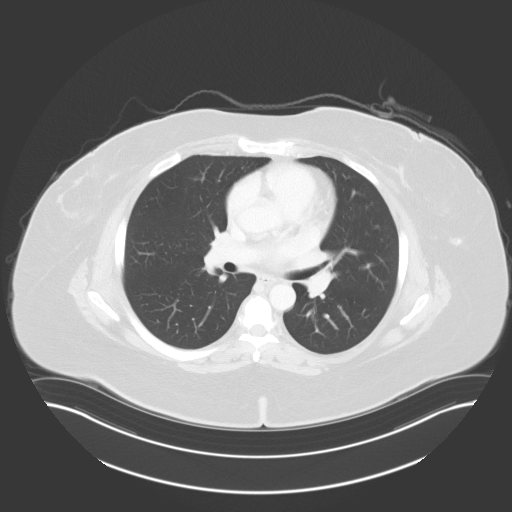
[im 120/134  mediastinal]
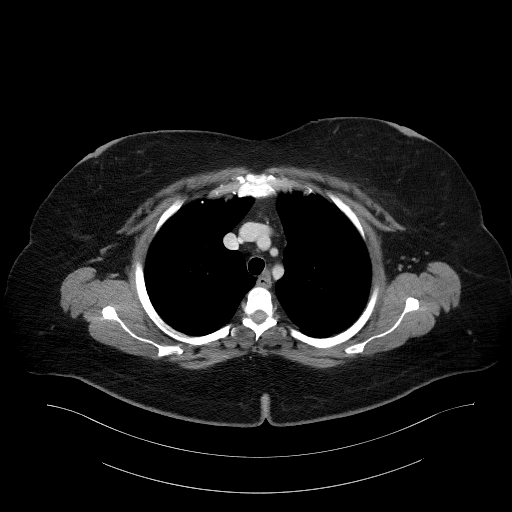
[im 120/134  lung]
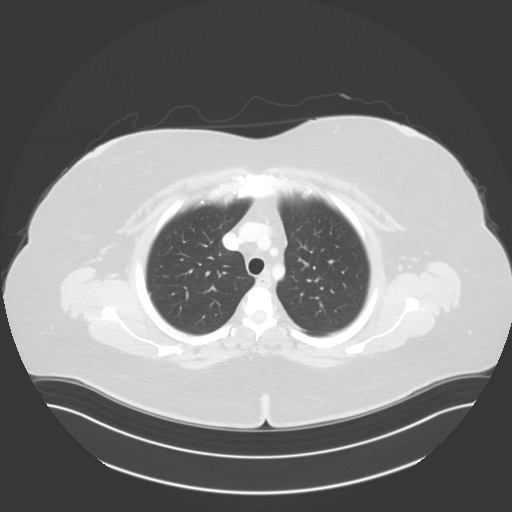

[Series 5: coronals · coronal · 0.92mm/px · 3 of 171 slices shown]
[im 35/171  lung]
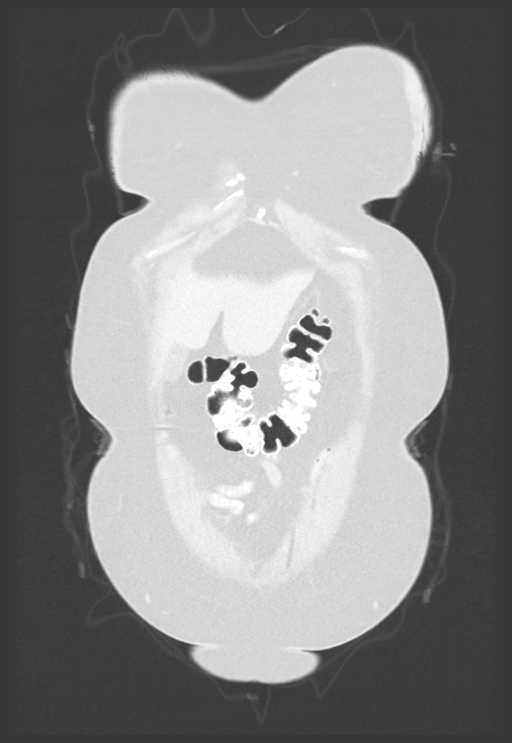
[im 69/171  lung]
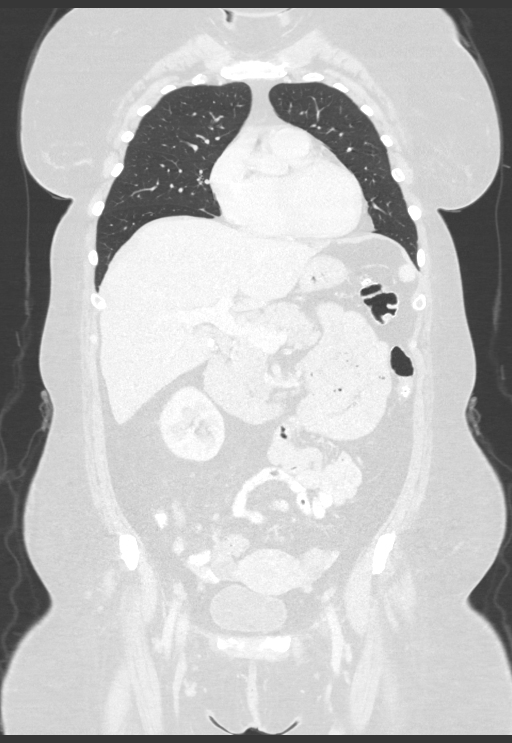
[im 103/171  lung]
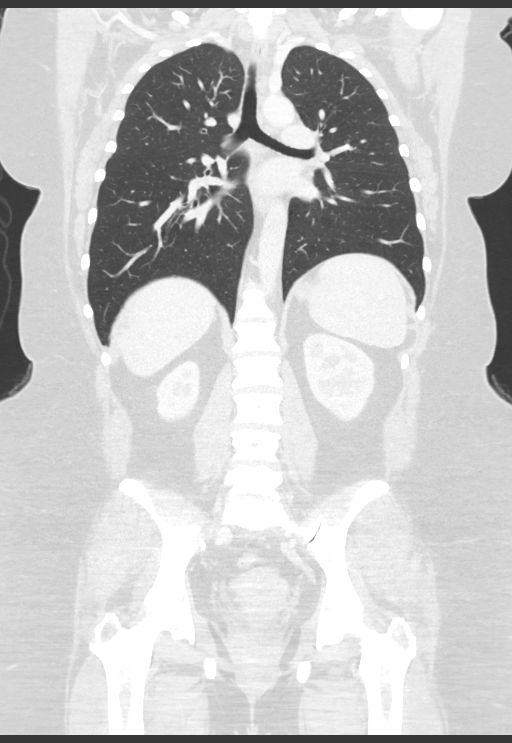

[12 of 36 positions shown; findings below may reference images not displayed]

FINDINGS: CT CHEST FINDINGS

Cardiovascular: The heart size is normal. No substantial pericardial
effusion. No thoracic aortic aneurysm.

Mediastinum/Nodes: No mediastinal lymphadenopathy. There is no hilar
lymphadenopathy. The esophagus has normal imaging features. There is
no axillary lymphadenopathy.

Lungs/Pleura: 2 mm nodule identified right costophrenic sulcus
posteriorly on 154/4. 4 mm left perifissural nodule identified on
32/4. No suspicious pulmonary nodule or mass. No focal airspace
consolidation. Insert no effusions

Musculoskeletal: No worrisome lytic or sclerotic osseous
abnormality.

CT ABDOMEN PELVIS FINDINGS

Hepatobiliary: 10 mm hypoattenuating lesion identified in the
central liver near the junction of segments IV and VIII (50/2).
Small area of low attenuation in the anterior liver, adjacent to the
falciform ligament, is in a characteristic location for focal fatty
deposition. Calcified gallstones evident. No intrahepatic or
extrahepatic biliary dilation.

Pancreas: No focal mass lesion. No dilatation of the main duct. No
intraparenchymal cyst. No peripancreatic edema.

Spleen: No splenomegaly. No focal mass lesion.

Adrenals/Urinary Tract: No adrenal nodule or mass. Punctate
nonobstructing stone noted lower pole right kidney. Left kidney
unremarkable. No evidence for hydroureter. The urinary bladder
appears normal for the degree of distention.

Stomach/Bowel: Stomach is unremarkable. No gastric wall thickening.
No evidence of outlet obstruction. Duodenum is normally positioned
as is the ligament of Treitz. No small bowel wall thickening. No
small bowel dilatation. The terminal ileum is normal. The appendix
is normal. Subtle area of wall thickening noted mid sigmoid colon
(axial image 99/series 2) reported location of patient's primary
neoplasm.

Vascular/Lymphatic: No abdominal aortic aneurysm. No abdominal
lymphadenopathy No pelvic sidewall lymphadenopathy.

Reproductive: The uterus is unremarkable.  There is no adnexal mass.

Other: No intraperitoneal free fluid.

Musculoskeletal: No worrisome lytic or sclerotic osseous
abnormality.
IMPRESSION: 1. Subtle area of wall thickening noted mid sigmoid colon, reported
location of patient's primary neoplasm.
2. 10 mm hypoattenuating lesion in the central liver near the
junction of segments IV and VIII. This is indeterminate. MRI abdomen
with and without contrast recommended to further evaluate.
3. Tiny bilateral pulmonary nodules. While most likely benign.
Surveillance recommended to ensure stability.
4. Cholelithiasis.
5. Punctate nonobstructing right renal stone.

## 2020-05-07 IMAGING — CT CT ABD-PELV W/ CM
2 of 5 series · 12 of 36 positions shown, 15 images · IV contrast (Omnipaque)
Comparison: None.

CLINICAL DATA: Colon cancer.  Staging.

EXAM:
CT CHEST, ABDOMEN, AND PELVIS WITH CONTRAST
TECHNIQUE: Multidetector CT imaging of the chest, abdomen and pelvis was
performed following the standard protocol during bolus
administration of intravenous contrast.
CONTRAST:  100mL OMNIPAQUE IOHEXOL 300 MG/ML  SOLN

[Series 2: cap with 2 · axial · 0.96mm/px · z∈[-466,+64]mm · 9 of 134 slices shown, 12 images]
[im 14/134  mediastinal]
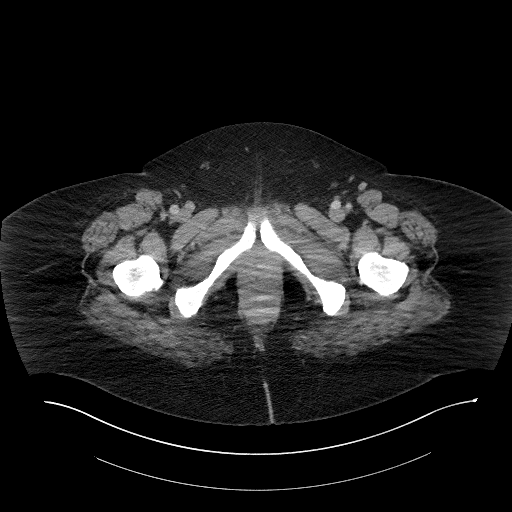
[im 14/134  lung]
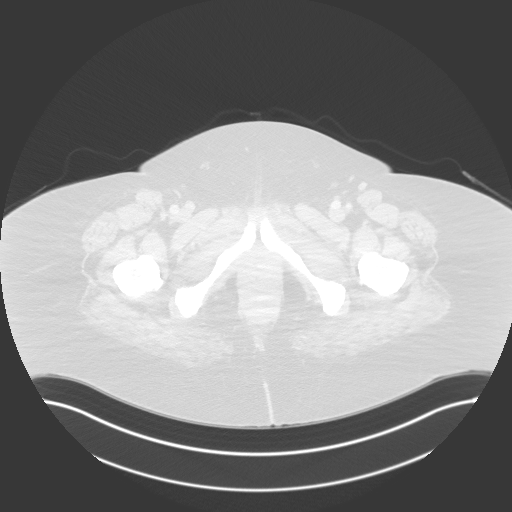
[im 27/134  lung]
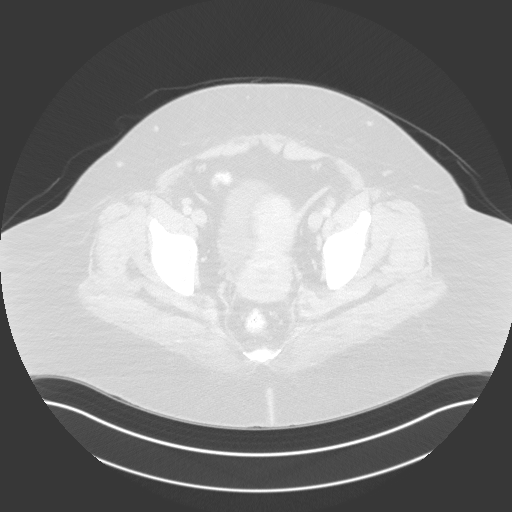
[im 40/134  lung]
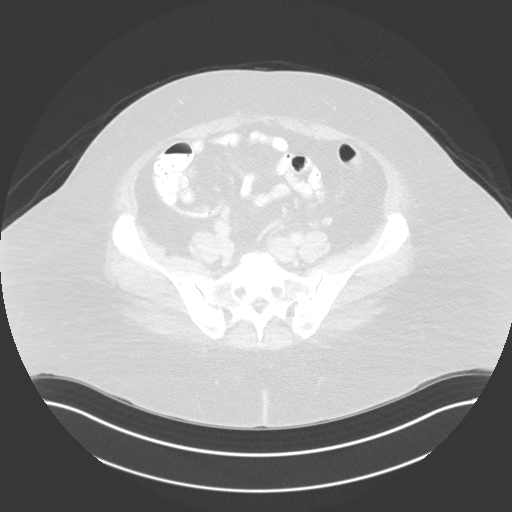
[im 54/134  lung]
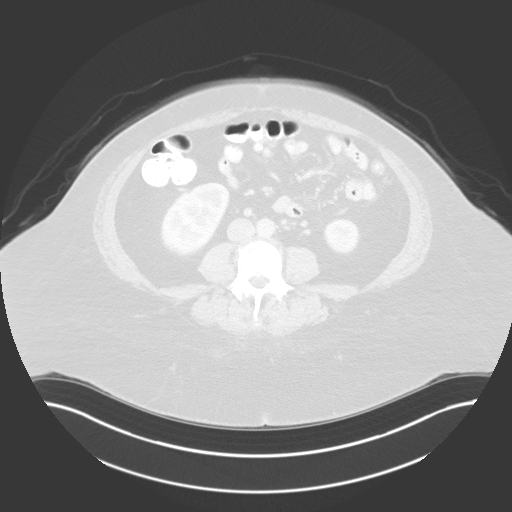
[im 67/134  mediastinal]
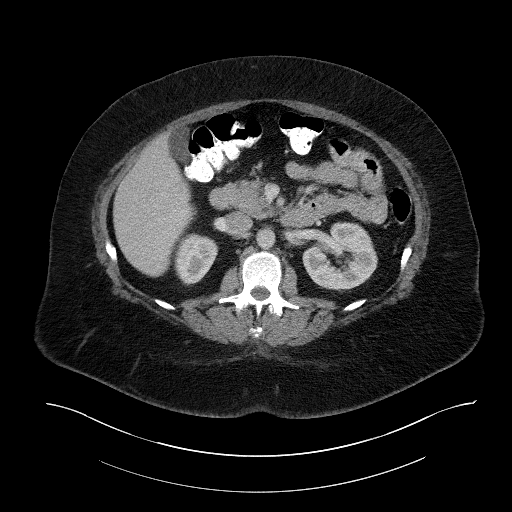
[im 67/134  lung]
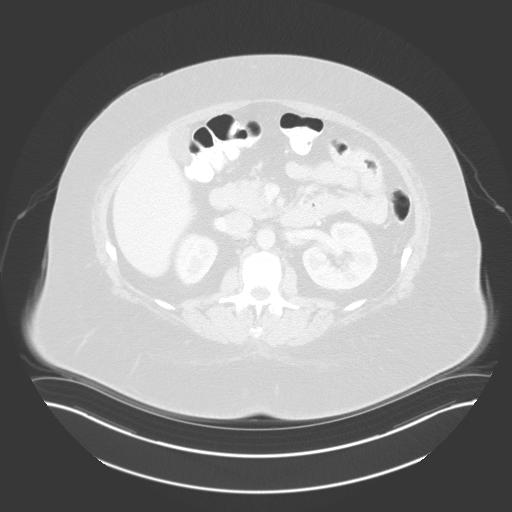
[im 80/134  lung]
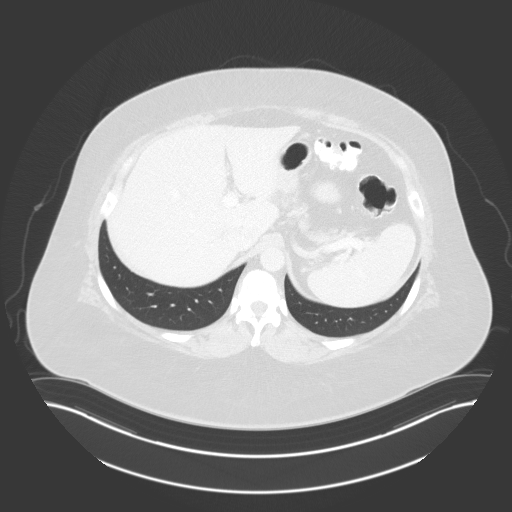
[im 94/134  lung]
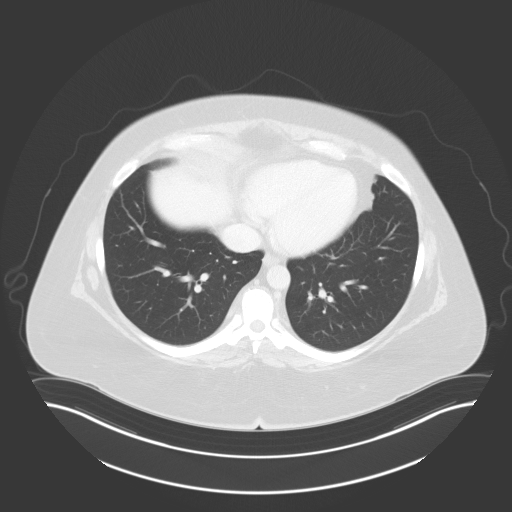
[im 107/134  lung]
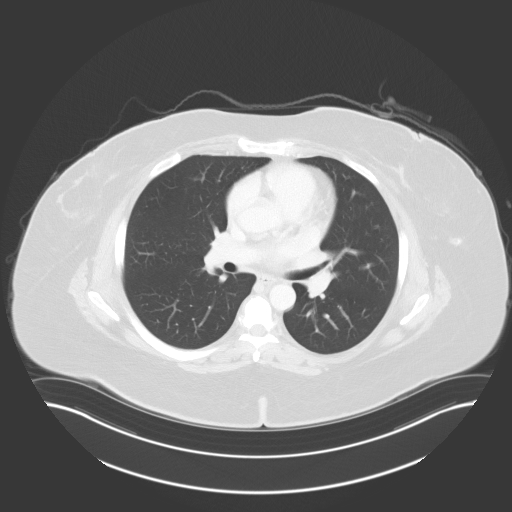
[im 120/134  mediastinal]
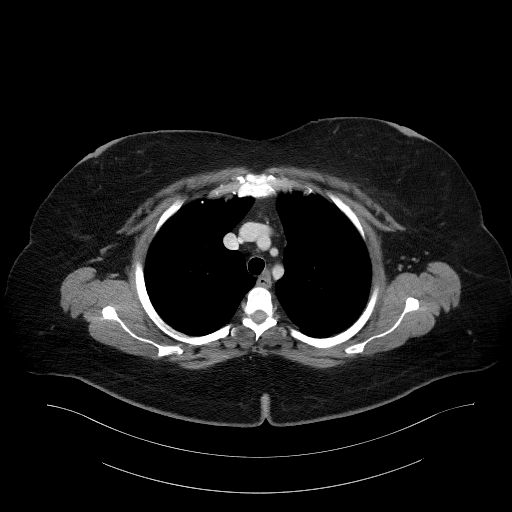
[im 120/134  lung]
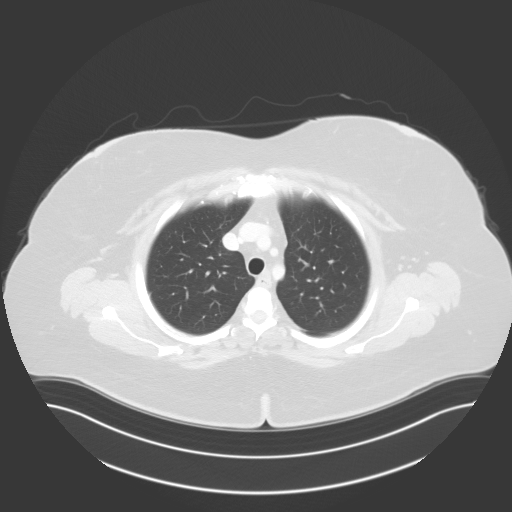

[Series 5: coronals · coronal · 0.92mm/px · 3 of 171 slices shown]
[im 35/171  lung]
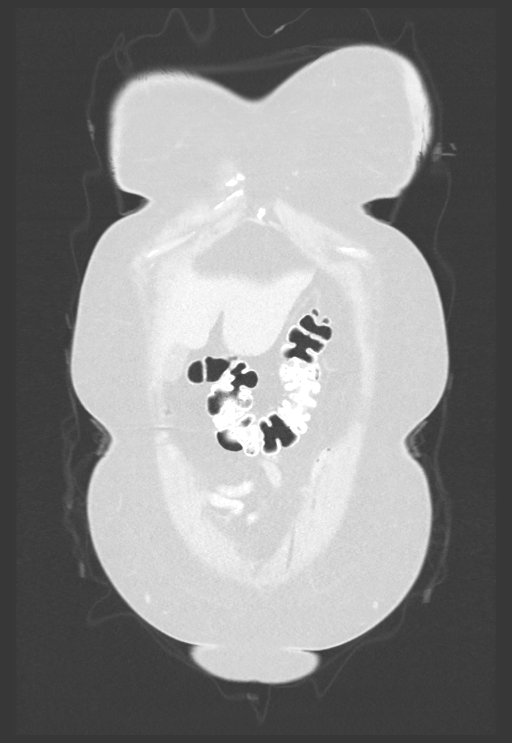
[im 69/171  lung]
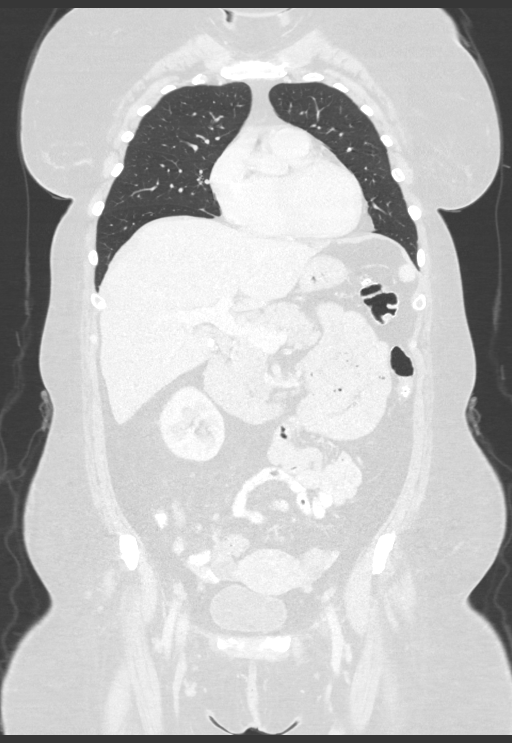
[im 103/171  lung]
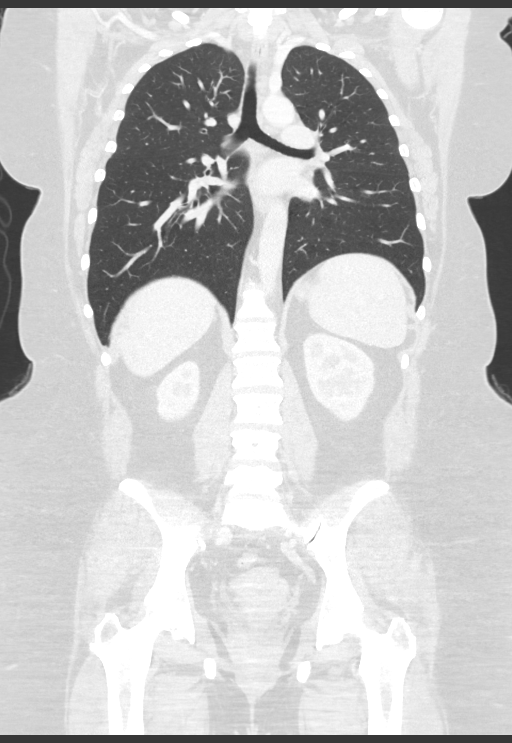

[12 of 36 positions shown; findings below may reference images not displayed]

FINDINGS: CT CHEST FINDINGS

Cardiovascular: The heart size is normal. No substantial pericardial
effusion. No thoracic aortic aneurysm.

Mediastinum/Nodes: No mediastinal lymphadenopathy. There is no hilar
lymphadenopathy. The esophagus has normal imaging features. There is
no axillary lymphadenopathy.

Lungs/Pleura: 2 mm nodule identified right costophrenic sulcus
posteriorly on 154/4. 4 mm left perifissural nodule identified on
32/4. No suspicious pulmonary nodule or mass. No focal airspace
consolidation. Insert no effusions

Musculoskeletal: No worrisome lytic or sclerotic osseous
abnormality.

CT ABDOMEN PELVIS FINDINGS

Hepatobiliary: 10 mm hypoattenuating lesion identified in the
central liver near the junction of segments IV and VIII (50/2).
Small area of low attenuation in the anterior liver, adjacent to the
falciform ligament, is in a characteristic location for focal fatty
deposition. Calcified gallstones evident. No intrahepatic or
extrahepatic biliary dilation.

Pancreas: No focal mass lesion. No dilatation of the main duct. No
intraparenchymal cyst. No peripancreatic edema.

Spleen: No splenomegaly. No focal mass lesion.

Adrenals/Urinary Tract: No adrenal nodule or mass. Punctate
nonobstructing stone noted lower pole right kidney. Left kidney
unremarkable. No evidence for hydroureter. The urinary bladder
appears normal for the degree of distention.

Stomach/Bowel: Stomach is unremarkable. No gastric wall thickening.
No evidence of outlet obstruction. Duodenum is normally positioned
as is the ligament of Treitz. No small bowel wall thickening. No
small bowel dilatation. The terminal ileum is normal. The appendix
is normal. Subtle area of wall thickening noted mid sigmoid colon
(axial image 99/series 2) reported location of patient's primary
neoplasm.

Vascular/Lymphatic: No abdominal aortic aneurysm. No abdominal
lymphadenopathy No pelvic sidewall lymphadenopathy.

Reproductive: The uterus is unremarkable.  There is no adnexal mass.

Other: No intraperitoneal free fluid.

Musculoskeletal: No worrisome lytic or sclerotic osseous
abnormality.
IMPRESSION: 1. Subtle area of wall thickening noted mid sigmoid colon, reported
location of patient's primary neoplasm.
2. 10 mm hypoattenuating lesion in the central liver near the
junction of segments IV and VIII. This is indeterminate. MRI abdomen
with and without contrast recommended to further evaluate.
3. Tiny bilateral pulmonary nodules. While most likely benign.
Surveillance recommended to ensure stability.
4. Cholelithiasis.
5. Punctate nonobstructing right renal stone.

## 2020-05-07 MED ORDER — IOHEXOL 300 MG/ML  SOLN
100.0000 mL | Freq: Once | INTRAMUSCULAR | Status: AC | PRN
  Administered 2020-05-07: 100 mL via INTRAVENOUS

## 2020-05-07 NOTE — Telephone Encounter (Signed)
Left message for patient to please call back. 

## 2020-05-08 NOTE — Telephone Encounter (Signed)
See CT result note

## 2020-05-16 ENCOUNTER — Other Ambulatory Visit: Payer: Self-pay

## 2020-05-16 ENCOUNTER — Ambulatory Visit (HOSPITAL_COMMUNITY)
Admission: RE | Admit: 2020-05-16 | Discharge: 2020-05-16 | Disposition: A | Discharge status: Home / Self Care | Payer: BC Managed Care – PPO | Source: Ambulatory Visit | Attending: Internal Medicine | Admitting: Internal Medicine

## 2020-05-16 DIAGNOSIS — R932 Abnormal findings on diagnostic imaging of liver and biliary tract: Secondary | ICD-10-CM | POA: Insufficient documentation

## 2020-05-16 DIAGNOSIS — C189 Malignant neoplasm of colon, unspecified: Secondary | ICD-10-CM | POA: Diagnosis not present

## 2020-05-16 DIAGNOSIS — K8020 Calculus of gallbladder without cholecystitis without obstruction: Secondary | ICD-10-CM | POA: Diagnosis not present

## 2020-05-16 IMAGING — MR MR ABDOMEN WO/W CM
18 of 20 series · 44 of 48 positions shown · IV contrast (gadavist)
Comparison: CT on [DATE]

CLINICAL DATA: Indeterminate liver lesion on recent CT. Colon
carcinoma.

EXAM:
MRI ABDOMEN WITHOUT AND WITH CONTRAST
TECHNIQUE: Multiplanar multisequence MR imaging of the abdomen was performed
both before and after the administration of intravenous contrast.
CONTRAST:  10mL GADAVIST GADOBUTROL 1 MMOL/ML IV SOLN

[Series 3: T2 · coronal · 6.0mm · 1.76mm/px · 2 of 37 slices shown (1 of 2)]
[im 1/37]
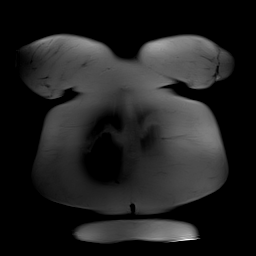
[im 37/37]
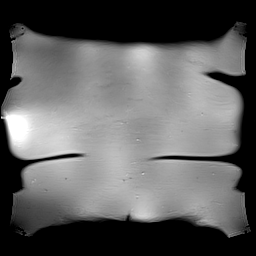

[Series 4: T2 fat-sat · axial · 6.0mm · 1.31mm/px · z∈[-186,+124]mm · 3 of 44 slices shown]
[im 1/44]
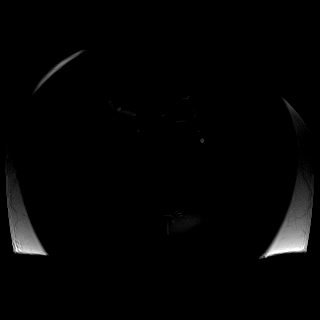
[im 22/44]
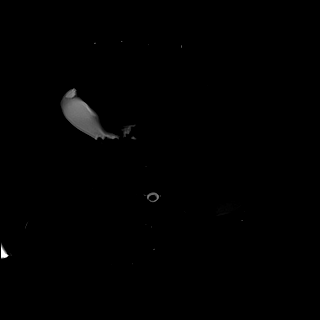
[im 44/44]
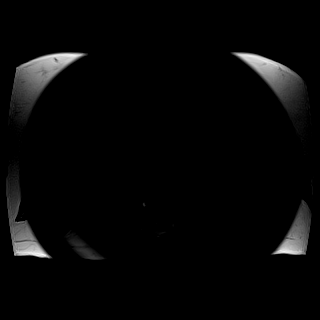

[Series 6: DWI · axial · 6.0mm · 1.77mm/px · z∈[-204,+77]mm · 3 of 80 slices shown (1 of 2)]
[im 1/80]
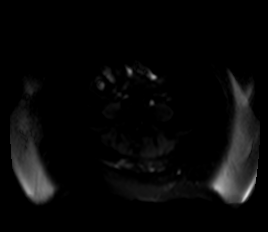
[im 40/80]
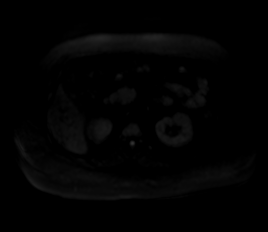
[im 80/80]
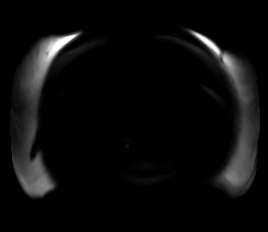

[Series 7: DWI · axial · 6.0mm · 1.77mm/px · 1 of 40 slices shown (2 of 2)]
[im 1/40]
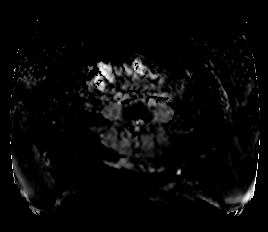

[Series 8: T1 · axial · 3.6mm · 1.48mm/px · z∈[-193,+62]mm · 2 of 72 slices shown (1 of 2)]
[im 1/72]
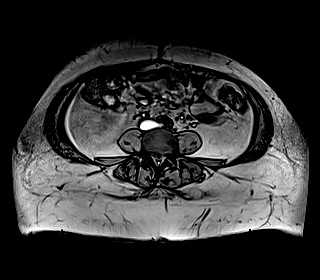
[im 72/72]
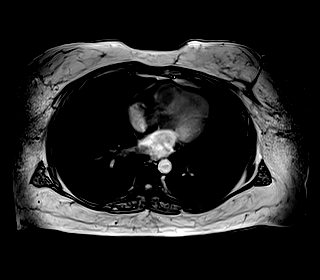

[Series 9: T1 · axial · 3.6mm · 1.48mm/px · z∈[-193,+62]mm · 2 of 72 slices shown (2 of 2)]
[im 1/72]
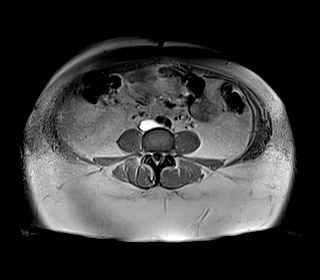
[im 72/72]
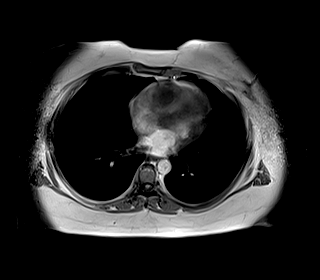

[Series 10: bSSFP · axial · 4.0mm · 0.94mm/px · z∈[-208,+76]mm · 2 of 72 slices shown]
[im 1/72]
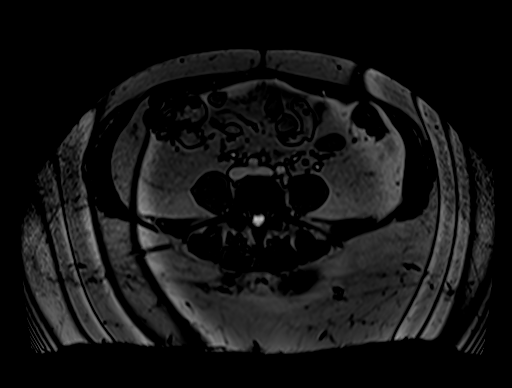
[im 72/72]
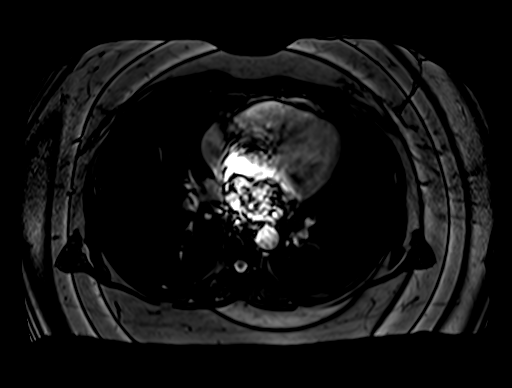

[Series 12: T1 dynamic · axial · 3.0mm · 1.48mm/px · z∈[-215,+70]mm · 3 of 96 slices shown (1 of 6)]
[im 1/96]
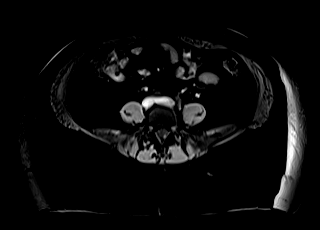
[im 48/96]
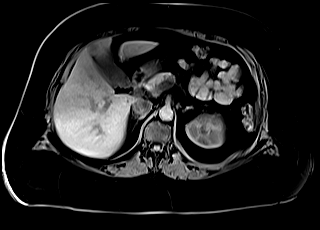
[im 96/96]
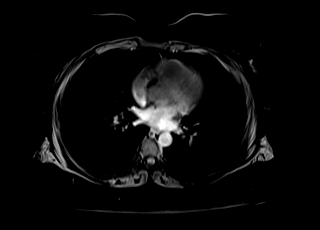

[Series 15: T1 dynamic · axial · 3.0mm · 1.48mm/px · z∈[-215,+70]mm · 3 of 96 slices shown (2 of 6)]
[im 1/96]
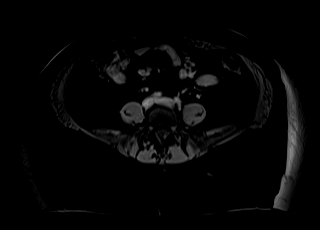
[im 48/96]
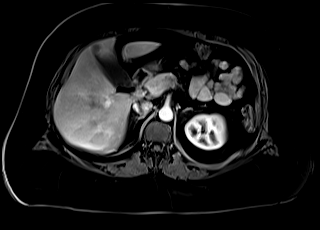
[im 96/96]
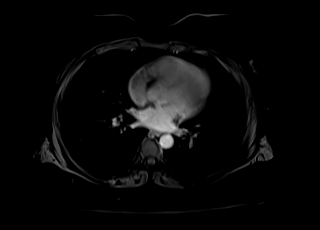

[Series 17: T1 dynamic · axial · 3.0mm · 1.48mm/px · z∈[-215,+70]mm · 3 of 96 slices shown (3 of 6)]
[im 1/96]
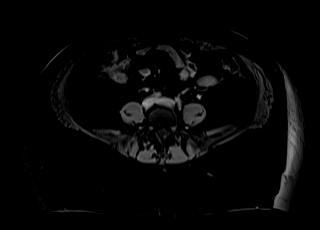
[im 48/96]
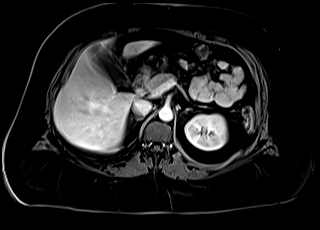
[im 96/96]
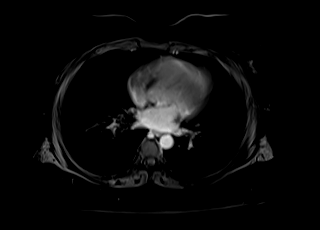

[Series 19: T1 dynamic · axial · 3.0mm · 1.48mm/px · z∈[-215,+70]mm · 3 of 96 slices shown (4 of 6)]
[im 1/96]
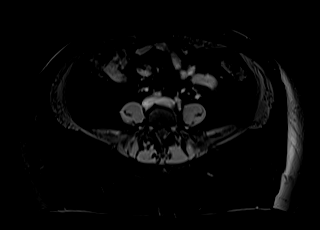
[im 48/96]
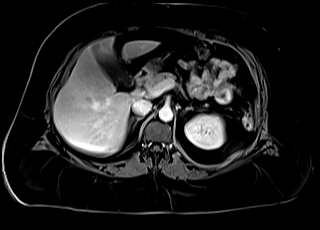
[im 96/96]
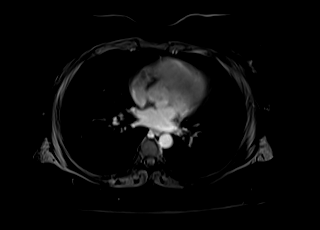

[Series 21: T1 dynamic · coronal · 5.0mm · 1.48mm/px · 2 of 48 slices shown (5 of 6)]
[im 1/48]
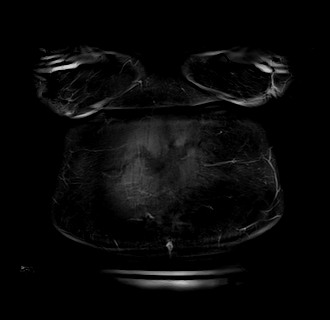
[im 48/48]
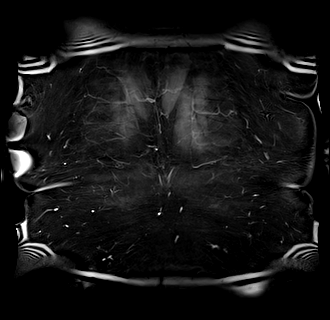

[Series 22: T2 · axial · 6.0mm · 1.84mm/px · 1 of 42 slices shown (2 of 2)]
[im 1/42]
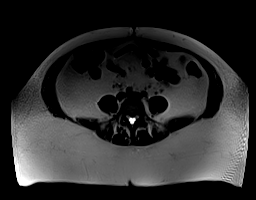

[Series 24: T1 dynamic · axial · 3.0mm · 1.48mm/px · z∈[-215,+70]mm · 3 of 96 slices shown (6 of 6)]
[im 1/96]
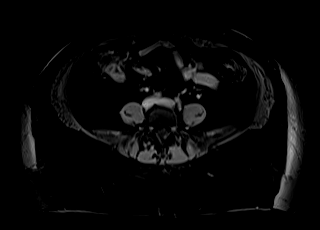
[im 48/96]
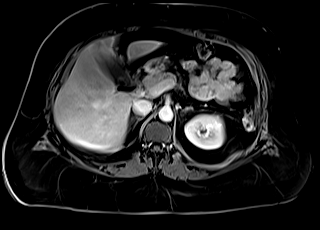
[im 96/96]
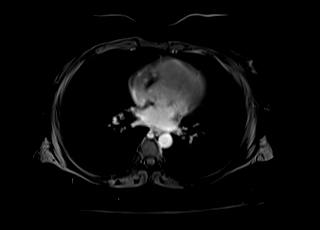

[Series 100: sub_20 sec · axial · 3.0mm · 1.48mm/px · z∈[-215,+70]mm · 3 of 96 slices shown]
[im 1/96]
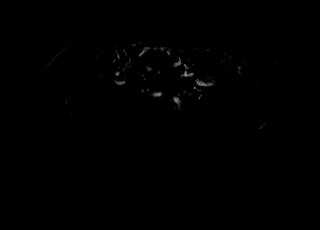
[im 48/96]
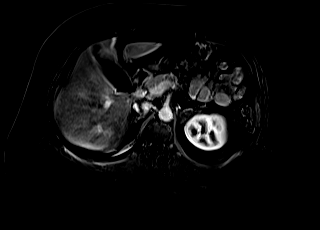
[im 96/96]
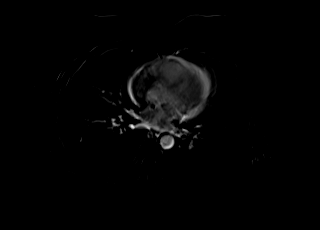

[Series 101: sub_45 sec · axial · 3.0mm · 1.48mm/px · z∈[-215,+70]mm · 3 of 96 slices shown]
[im 1/96]
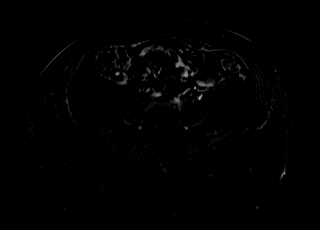
[im 48/96]
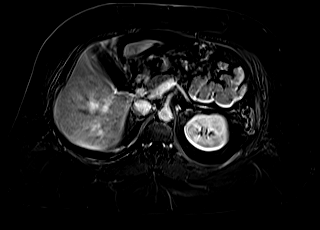
[im 96/96]
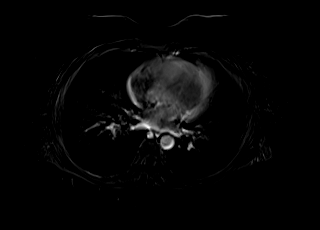

[Series 102: sub_90 sec · axial · 3.0mm · 1.48mm/px · z∈[-215,+70]mm · 3 of 96 slices shown]
[im 1/96]
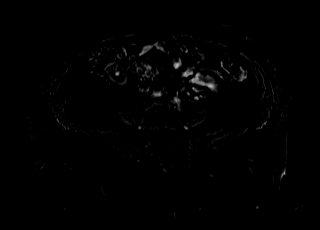
[im 48/96]
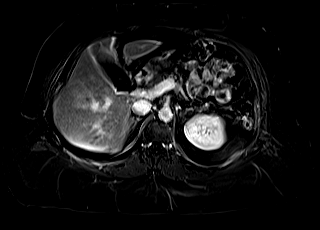
[im 96/96]
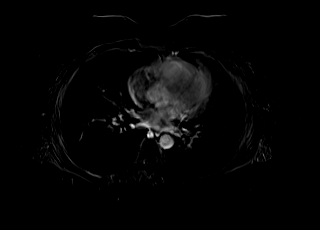

[Series 103: sub_delay · axial · 3.0mm · 1.48mm/px · z∈[-143,+70]mm · 2 of 72 slices shown]
[im 1/72]
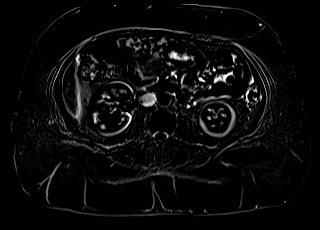
[im 72/72]
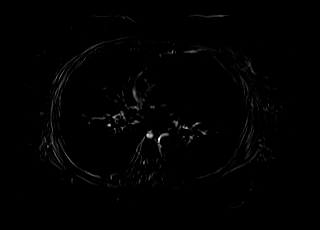

[44 of 48 positions shown; findings below may reference images not displayed]

FINDINGS: Lower chest: No acute findings.

Hepatobiliary: A 6 mm T2 hyperintense lesion is seen in the dome of
the right lobe, best seen on image [DATE]. This is poorly visualized
on subtraction imaging due to its small size and some respiratory
motion, and cannot be fully characterized. No other liver lesions
are identified. Small less than 1 cm gallstones are noted, however
there is no evidence of cholecystitis or biliary ductal dilatation.

Pancreas:  No mass or inflammatory changes.

Spleen:  Within normal limits in size and appearance.

Adrenals/Urinary Tract: No masses identified. No evidence of
hydronephrosis.

Stomach/Bowel: Visualized portion unremarkable.

Vascular/Lymphatic: No pathologically enlarged lymph nodes
identified. No abdominal aortic aneurysm.

Other:  None.

Musculoskeletal:  No suspicious bone lesions identified.
IMPRESSION: 6 mm indeterminate lesion in the dome of the right hepatic lobe,
which cannot be fully characterized due to its small size and some
respiratory motion. Recommend continued follow-up by abdomen MRI
without and with contrast in 3-4 months.

Cholelithiasis. No radiographic evidence of cholecystitis or biliary
ductal dilatation.

## 2020-05-16 MED ORDER — GADOBUTROL 1 MMOL/ML IV SOLN
10.0000 mL | Freq: Once | INTRAVENOUS | Status: AC | PRN
  Administered 2020-05-16: 10 mL via INTRAVENOUS

## 2020-05-17 NOTE — Telephone Encounter (Signed)
Patient scheduled to see Dr. Dema Severin with [facility] on 05/18/20 1:30

## 2020-05-18 DIAGNOSIS — C187 Malignant neoplasm of sigmoid colon: Secondary | ICD-10-CM | POA: Diagnosis not present

## 2020-05-23 ENCOUNTER — Telehealth: Payer: Self-pay | Admitting: Genetic Counselor

## 2020-05-23 ENCOUNTER — Other Ambulatory Visit: Payer: Self-pay

## 2020-05-23 NOTE — Progress Notes (Signed)
The proposed treatment discussed in conference is for discussion purposes only and is not a binding recommendation.  The patients have not been physically examined, or presented with their treatment options.  Therefore, final treatment plans cannot be decided.   

## 2020-05-23 NOTE — H&P (View-Only) (Signed)
The proposed treatment discussed in conference is for discussion purposes only and is not a binding recommendation.  The patients have not been physically examined, or presented with their treatment options.  Therefore, final treatment plans cannot be decided.   

## 2020-05-23 NOTE — Telephone Encounter (Signed)
Received a genetic counseling referral from Dr. Dema Severin at [facility] for colon cancer. Elizabeth Dixon has been cld and scheduled for a mychart video visit on 2/1 at 9am. I verified the pt has an active mychart acct.

## 2020-05-24 NOTE — Patient Instructions (Addendum)
DUE TO COVID-19 ONLY ONE VISITOR IS ALLOWED TO COME WITH YOU AND STAY IN THE WAITING ROOM ONLY DURING PRE OP AND PROCEDURE DAY OF SURGERY. THE 1 VISITOR  MAY VISIT WITH YOU AFTER SURGERY IN YOUR PRIVATE ROOM DURING VISITING HOURS ONLY!  YOU NEED TO HAVE A COVID 19 TEST ON__1/31_____ @_9 :10______, THIS TEST MUST BE DONE BEFORE SURGERY,  COVID TESTING SITE [place] [address], IT IS ON THE RIGHT GOING OUT [address] APPROXIMATELY  2 MINUTES PAST [employer] ON THE RIGHT. ONCE YOUR COVID TEST IS COMPLETED,  PLEASE BEGIN THE QUARANTINE INSTRUCTIONS AS OUTLINED IN YOUR HANDOUT.                Layah Abello    Your procedure is scheduled on: 05/31/20   Report to [facility]  Entrance   Report to admitting at  11:00 AM     Call this number if you have problems the morning of surgery [phone]   Follow all instructions for bowel prep from Dr. Orest Dikes office.  Drink plenty of fluids the day of prep to prevent dehydration  DRINK 2 PRESURGERY ENSURE DRINKS THE NIGHT BEFORE SURGERY AT 10:00 PM      NO SOLIDS AFTER MIDNIGHT THE DAY PRIOR TO THE SURGERY.   NOTHING BY MOUTH EXCEPT CLEAR LIQUIDS UNTIL 10:00 AM    Have the last ensure finished by 10:00 am   BRUSH YOUR TEETH MORNING OF SURGERY AND RINSE YOUR MOUTH OUT, NO CHEWING GUM CANDY OR MINTS.     Take these medicines the morning of surgery with A SIP OF WATER: none                                 You may not have any metal on your body including hair pins and              piercings  Do not wear jewelry, make-up, lotions, powders or perfumes, deodorant             Do not wear nail polish on your fingernails.  Do not shave  48 hours prior to surgery.             Do not bring valuables to the hospital. [place].  Contacts, dentures or bridgework may not be worn into surgery.      Special Instructions: N/A              Please read over  the following fact sheets you were given: _____________________________________________________________________             [facility] - Preparing for Surgery Before surgery, you can play an important role.   Because skin is not sterile, your skin needs to be as free of germs as possible.   You can reduce the number of germs on your skin by washing with CHG (chlorahexidine gluconate) soap before surgery.   CHG is an antiseptic cleaner which kills germs and bonds with the skin to continue killing germs even after washing. Please DO NOT use if you have an allergy to CHG or antibacterial soaps.   If your skin becomes reddened/irritated stop using the CHG and inform your nurse when you arrive at Short Stay. Do not shave (including legs and underarms) for at least 48 hours  prior to the first CHG shower.  Please follow these instructions carefully:  1.  Shower with CHG Soap the night before surgery and the  morning of Surgery.  2.  If you choose to wash your hair, wash your hair first as usual with your  normal  shampoo.  3.  After you shampoo, rinse your hair and body thoroughly to remove the  shampoo.                                        4.  Use CHG as you would any other liquid soap.  You can apply chg directly  to the skin and wash                       Gently with a scrungie or clean washcloth.  5.  Apply the CHG Soap to your body ONLY FROM THE NECK DOWN.   Do not use on face/ open                           Wound or open sores. Avoid contact with eyes, ears mouth and genitals (private parts).                       Wash face,  Genitals (private parts) with your normal soap.             6.  Wash thoroughly, paying special attention to the area where your surgery  will be performed.  7.  Thoroughly rinse your body with warm water from the neck down.  8.  DO NOT shower/wash with your normal soap after using and rinsing off  the CHG Soap.             9.  Pat yourself dry with a clean towel.             10.  Wear clean pajamas.            11.  Place clean sheets on your bed the night of your first shower and do not  sleep with pets. Day of Surgery : Do not apply any lotions/deodorants the morning of surgery.  Please wear clean clothes to the hospital/surgery center.  FAILURE TO FOLLOW THESE INSTRUCTIONS MAY RESULT IN THE CANCELLATION OF YOUR SURGERY PATIENT SIGNATURE_________________________________  NURSE SIGNATURE__________________________________  ________________________________________________________________________

## 2020-05-25 ENCOUNTER — Ambulatory Visit: Payer: Self-pay | Admitting: Surgery

## 2020-05-25 NOTE — H&P (Signed)
CC: Referred by Dr. Carlean Purl for newly diagnosed sigmoid colon cancer  HPI: Ms. Elizabeth Dixon is a very pleasant 45yoF with history of DCIS, MS (on no medications x10 yrs) and is referred to our office for evaluation of a newly diagnosed sigmoid colon cancer. She reports that approximately 6 months ago she developed issues with intermittent bright red blood per rectum that was coating her stool. She also occasionally have cramps followed by passage of blood. She denies any history of obstructive type symptoms. She was seen by her PCP and she reports having been told it could've been attributable to hemorrhoids. This however continued over the ensuing 6 months and she was referred to see GI. She saw Dr. Carlean Purl and underwent her first colonoscopy 04/23/20. This is notable for an approximately 3 cm distal sigmoid mass that was biopsied and tattooed. There were also a few small polyps removed that were tubular adenomas.  She underwent staging CT chest/abdomen/pelvis on 05/07/20. This demonstrated a subtle-appearing lesion in what appeared to be the mid sigmoid colon. There is no evidence of metastatic disease. There are a few small liver lesions 6 mm in size. Subsequent MRI was done. These are indeterminate but warrant surveillance.  She reports as for her breast cancer she had undergone a lumpectomy followed by radiation for DCIS 2016 at [facility]. She believes that she saw a genetic counselor that time and was told she did not have BRCA. She is not sure what other genes were tested.  She denies any history of incontinence to gas, liquid, or solid stool. She reports good control with her bowel movements.  PMH: DCIS; Multiple sclerosis  PSH: C-section via Pfannenstiel; prior vaginal delivery complicated by a fourth degree tear which opened and had to be reclosed one week later.  FHx: Denies FHx of colorectal, breast, endometrial, ovarian or cervical cancer  Social: Denies use of tobacco/drugs;  occasional social etoh use. 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  ROS: A comprehensive 10 system review of systems was completed with the patient and pertinent findings as noted above.  The patient is a 45 year old female.   Past Surgical History Mammie Lorenzo, LPN; 12/13/5629 4:97 PM) Breast Mass; Local Excision  Left. Cesarean Section - 1  Colon Polyp Removal - Colonoscopy  Mammoplasty; Reduction  Bilateral.  Diagnostic Studies History Mammie Lorenzo, LPN; 0/26/3785 8:85 PM) Colonoscopy  within last year Mammogram  within last year Pap Smear  1-5 years ago  Allergies Mammie Lorenzo, LPN; 0/27/7412 8:78 PM) No Known Drug Allergies  [05/18/2020]: Allergies Reconciled   Medication History Mammie Lorenzo, LPN; 6/76/7209 4:70 PM) Multi-Vitamin (Oral) Active. Zinc (50MG Tablet, Oral) Active. Vitamin D3 (125 MCG(5000 UT) Capsule, Oral) Active. Medications Reconciled  Social History Mammie Lorenzo, LPN; 9/62/8366 2:94 PM) Caffeine use  Carbonated beverages, Coffee. Illicit drug use  Remotely quit drug use. Tobacco use  Former smoker.  Family History Mammie Lorenzo, LPN; 7/65/4650 3:54 PM) Colon Polyps  Mother. Prostate Cancer  Father. Thyroid problems  Mother.  Pregnancy / Birth History Mammie Lorenzo, LPN; 6/56/8127 5:17 PM) Age at menarche  29 years. Gravida  3 Length (months) of breastfeeding  7-12 Maternal age  64-35 Para  2 Regular periods   Other Problems Mammie Lorenzo, LPN; 0/04/7492 4:96 PM) Breast Cancer  Colon Cancer  Hemorrhoids  Hypercholesterolemia  Lump In Breast  Other disease, cancer, significant illness     Review of Systems Harrell Gave M. Twania Bujak MD; 05/18/2020 2:01 PM) General Not Present-  Appetite Loss, Chills, Fatigue, Fever, Night Sweats, Weight Gain and Weight Loss. HEENT Not Present- Earache, Hearing Loss, Hoarseness, Nose Bleed, Oral Ulcers, Ringing in the Ears, Seasonal Allergies, Sinus Pain,  Sore Throat, Visual Disturbances, Wears glasses/contact lenses and Yellow Eyes. Respiratory Not Present- Bloody sputum, Chronic Cough, Difficulty Breathing, Snoring and Wheezing. Breast Not Present- Breast Mass, Breast Pain, Nipple Discharge and Skin Changes. Cardiovascular Not Present- Chest Pain, Difficulty Breathing Lying Down, Leg Cramps, Palpitations, Rapid Heart Rate, Shortness of Breath and Swelling of Extremities. Gastrointestinal Present- Bloody Stool and Hemorrhoids. Not Present- Abdominal Pain, Bloating, Change in Bowel Habits, Chronic diarrhea, Constipation, Difficulty Swallowing, Excessive gas, Gets full quickly at meals, Indigestion, Nausea, Rectal Pain and Vomiting. Female Genitourinary Not Present- Frequency, Nocturia, Painful Urination, Pelvic Pain and Urgency. Musculoskeletal Not Present- Back Pain, Joint Pain, Joint Stiffness, Muscle Pain, Muscle Weakness and Swelling of Extremities. Neurological Not Present- Attention Deficit, Decreased Memory and Difficulty Speaking. Psychiatric Not Present- Anxiety and Depression. Endocrine Not Present- Appetite Changes and Cold Intolerance. Hematology Not Present- Abnormal Bleeding, Blood Clots and Blood Thinners.  Vitals Claiborne Billings Dockery LPN; 12/06/313 9:45 PM) 05/18/2020 1:20 PM Weight: 273.2 lb Height: 64in Body Surface Area: 2.23 m Body Mass Index: 46.89 kg/m  Pulse: 90 (Regular)  BP: 114/76(Sitting, Left Arm, Standard)       Physical Exam Harrell Gave M. Enslie Sahota MD; 05/18/2020 2:01 PM) The physical exam findings are as follows: Note: Constitutional: No acute distress; conversant; no deformities; wearing mask Eyes: Moist conjunctiva; no lid lag; anicteric sclerae; pupils equal and round Neck: Trachea midline; no palpable thyromegaly Lungs: Normal respiratory effort; no tactile fremitus CV: rrr; no palpable thrill; no pitting edema GI: Abdomen obese, soft, nontender, nondistended; no palpable hepatosplenomegaly MSK:  Normal gait; no clubbing/cyanosis Psychiatric: Appropriate affect; alert and oriented 3 Lymphatic: No palpable cervical or axillary lymphadenopathy    Assessment & Plan Harrell Gave M. Stela Iwasaki MD; 05/18/2020 2:04 PM) CANCER OF SIGMOID COLON (C18.7) Story: Elizabeth Dixon is a very pleasant 15yoF with hx of MS, DCIS - here for newly diagnosed sigmoid colon adenocarcinoma Cscope 04/23/20 - 3 cm mass like lesion in sigmoid, biopsied and tattoo'd CT CAP - subtle lesion presumably in mid sigmoid; no clear evidence of metastatic disease; some small liver lesions, confirmed indeterminate on MRI but will need to follow - too small to be biopsied  -The anatomy and physiology of the GI tract was discussed with the patient. The pathophysiology of colon cancer was discussed as well with associated pictures. -We have reviewed things going forward and recommended surgery. We discussed robotic-assisted sigmoidectomy, flexible sigmoidoscopy, intraoperative assessment of perfusion (ICG). -The planned procedure, material risks (including, but not limited to, pain, bleeding, infection, scarring, need for blood transfusion, damage to surrounding structures- blood vessels/nerves/viscus/organs, damage to ureter, leak from anastomosis, need for additional procedures, worsening of pre-existing medical conditions, need for stoma which may be permanent, hernia, recurrence, DVT/PE, pneumonia, heart attack, stroke, death) benefits and alternatives to surgery were discussed at length. The patient's questions were answered to her satisfaction, she voiced understanding and elected to proceed with surgery. Additionally, we discussed typical postoperative expectations and the recovery process.  This patient encounter took 48 minutes today to perform the following: take history, perform exam, review outside records, interpret imaging, counsel the patient on their diagnosis and document encounter, findings & plan in the EHR  Signed by  Ileana Roup, MD (05/18/2020 2:05 PM)

## 2020-05-28 ENCOUNTER — Other Ambulatory Visit (HOSPITAL_COMMUNITY)
Admission: RE | Admit: 2020-05-28 | Discharge: 2020-05-28 | Disposition: A | Discharge status: Home / Self Care | Payer: BC Managed Care – PPO | Source: Ambulatory Visit | Attending: Surgery | Admitting: Surgery

## 2020-05-28 ENCOUNTER — Other Ambulatory Visit: Payer: Self-pay

## 2020-05-28 ENCOUNTER — Encounter (HOSPITAL_COMMUNITY)
Admission: RE | Admit: 2020-05-28 | Discharge: 2020-05-28 | Disposition: A | Discharge status: Home / Self Care | Payer: BC Managed Care – PPO | Source: Ambulatory Visit | Attending: Surgery | Admitting: Surgery

## 2020-05-28 ENCOUNTER — Encounter (HOSPITAL_COMMUNITY): Payer: Self-pay

## 2020-05-28 DIAGNOSIS — Z87891 Personal history of nicotine dependence: Secondary | ICD-10-CM | POA: Diagnosis not present

## 2020-05-28 DIAGNOSIS — Z8042 Family history of malignant neoplasm of prostate: Secondary | ICD-10-CM | POA: Diagnosis not present

## 2020-05-28 DIAGNOSIS — D0512 Intraductal carcinoma in situ of left breast: Secondary | ICD-10-CM | POA: Diagnosis not present

## 2020-05-28 DIAGNOSIS — Z01812 Encounter for preprocedural laboratory examination: Secondary | ICD-10-CM | POA: Insufficient documentation

## 2020-05-28 DIAGNOSIS — E559 Vitamin D deficiency, unspecified: Secondary | ICD-10-CM | POA: Diagnosis not present

## 2020-05-28 DIAGNOSIS — G35 Multiple sclerosis: Secondary | ICD-10-CM | POA: Diagnosis not present

## 2020-05-28 DIAGNOSIS — C187 Malignant neoplasm of sigmoid colon: Secondary | ICD-10-CM | POA: Diagnosis not present

## 2020-05-28 DIAGNOSIS — K660 Peritoneal adhesions (postprocedural) (postinfection): Secondary | ICD-10-CM | POA: Diagnosis not present

## 2020-05-28 DIAGNOSIS — Z6841 Body Mass Index (BMI) 40.0 and over, adult: Secondary | ICD-10-CM | POA: Diagnosis not present

## 2020-05-28 DIAGNOSIS — Z803 Family history of malignant neoplasm of breast: Secondary | ICD-10-CM | POA: Diagnosis not present

## 2020-05-28 DIAGNOSIS — C19 Malignant neoplasm of rectosigmoid junction: Secondary | ICD-10-CM | POA: Diagnosis not present

## 2020-05-28 DIAGNOSIS — Z20822 Contact with and (suspected) exposure to covid-19: Secondary | ICD-10-CM | POA: Diagnosis not present

## 2020-05-28 DIAGNOSIS — E785 Hyperlipidemia, unspecified: Secondary | ICD-10-CM | POA: Diagnosis not present

## 2020-05-28 LAB — SARS CORONAVIRUS 2 (TAT 6-24 HRS): SARS Coronavirus 2: NEGATIVE

## 2020-05-28 LAB — CBC WITH DIFFERENTIAL/PLATELET
Abs Immature Granulocytes: 0.02 10*3/uL (ref 0.00–0.07)
Basophils Absolute: 0 10*3/uL (ref 0.0–0.1)
Basophils Relative: 1 %
Eosinophils Absolute: 0.2 10*3/uL (ref 0.0–0.5)
Eosinophils Relative: 3 %
HCT: 39.9 % (ref 36.0–46.0)
Hemoglobin: 13 g/dL (ref 12.0–15.0)
Immature Granulocytes: 0 %
Lymphocytes Relative: 33 %
Lymphs Abs: 1.9 10*3/uL (ref 0.7–4.0)
MCH: 29.8 pg (ref 26.0–34.0)
MCHC: 32.6 g/dL (ref 30.0–36.0)
MCV: 91.5 fL (ref 80.0–100.0)
Monocytes Absolute: 0.4 10*3/uL (ref 0.1–1.0)
Monocytes Relative: 8 %
Neutro Abs: 3.2 10*3/uL (ref 1.7–7.7)
Neutrophils Relative %: 55 %
Platelets: 231 10*3/uL (ref 150–400)
RBC: 4.36 MIL/uL (ref 3.87–5.11)
RDW: 13.3 % (ref 11.5–15.5)
WBC: 5.7 10*3/uL (ref 4.0–10.5)
nRBC: 0 % (ref 0.0–0.2)

## 2020-05-28 LAB — COMPREHENSIVE METABOLIC PANEL
ALT: 13 U/L (ref 0–44)
AST: 15 U/L (ref 15–41)
Albumin: 3.2 g/dL — ABNORMAL LOW (ref 3.5–5.0)
Alkaline Phosphatase: 75 U/L (ref 38–126)
Anion gap: 8 (ref 5–15)
BUN: 14 mg/dL (ref 6–20)
CO2: 27 mmol/L (ref 22–32)
Calcium: 9 mg/dL (ref 8.9–10.3)
Chloride: 105 mmol/L (ref 98–111)
Creatinine, Ser: 0.56 mg/dL (ref 0.44–1.00)
GFR, Estimated: >60 mL/min (ref >60)
Glucose, Bld: 91 mg/dL (ref 70–99)
Potassium: 4.3 mmol/L (ref 3.5–5.1)
Sodium: 140 mmol/L (ref 135–145)
Total Bilirubin: 0.4 mg/dL (ref 0.3–1.2)
Total Protein: 6.5 g/dL (ref 6.5–8.1)

## 2020-05-28 LAB — PROTIME-INR
INR: 0.9 (ref 0.8–1.2)
Prothrombin Time: 11.7 seconds (ref 11.4–15.2)

## 2020-05-28 LAB — HEMOGLOBIN A1C
Hgb A1c MFr Bld: 5.2 % (ref 4.8–5.6)
Mean Plasma Glucose: 102.54 mg/dL

## 2020-05-28 LAB — APTT: aPTT: 29 seconds (ref 24–36)

## 2020-05-28 NOTE — Progress Notes (Signed)
COVID Vaccine Completed:Yes Date COVID Vaccine completed:2/21- Booster 05/01/20 Pfizer COVID vaccine manufacturer:   [employer]ers   PCP - [facility] Cardiologist - no  Chest x-ray - no EKG - no Stress Test - no ECHO - no Cardiac Cath - no Pacemaker/ICD device last checked:NA  Sleep Study -no  CPAP -   Fasting Blood Sugar - NA Checks Blood Sugar _____ times a day  Blood Thinner Instructions:NA Aspirin Instructions: Last Dose:  Anesthesia review:   Patient denies shortness of breath, fever, cough and chest pain at PAT appointment  yes Patient verbalized understanding of instructions that were given to them at the PAT appointment. Patient was also instructed that they will need to review over the PAT instructions again at home before surgery. Yes. Pt can climb 2 flights of stairs, do housework and ADLs without SOB She has MS but no symptoms for 10 Years. No restrictions with her breast surgery.

## 2020-05-29 ENCOUNTER — Inpatient Hospital Stay: Payer: BC Managed Care – PPO | Attending: Genetic Counselor | Admitting: Genetic Counselor

## 2020-05-29 DIAGNOSIS — D0512 Intraductal carcinoma in situ of left breast: Secondary | ICD-10-CM

## 2020-05-29 DIAGNOSIS — Z803 Family history of malignant neoplasm of breast: Secondary | ICD-10-CM

## 2020-05-29 DIAGNOSIS — Z8042 Family history of malignant neoplasm of prostate: Secondary | ICD-10-CM | POA: Diagnosis not present

## 2020-05-29 DIAGNOSIS — C187 Malignant neoplasm of sigmoid colon: Secondary | ICD-10-CM

## 2020-05-29 NOTE — Progress Notes (Signed)
Ms. Leidner made aware to arrive at 7:30AM 05/31/20 for surgery and drink pre-surgery Ensure at 6:30AM she verbalized understanding.

## 2020-05-30 MED ORDER — BUPIVACAINE LIPOSOME 1.3 % IJ SUSP
20.0000 mL | INTRAMUSCULAR | Status: DC
  Filled 2020-05-30: qty 20

## 2020-05-30 NOTE — Anesthesia Preprocedure Evaluation (Signed)
Anesthesia Evaluation  Patient identified by MRN, date of birth, ID band Patient awake    Reviewed: Allergy & Precautions, NPO status , Patient's Chart, lab work & pertinent test results  History of Anesthesia Complications Negative for: history of anesthetic complications  Airway Mallampati: II  TM Distance: >3 FB Neck ROM: Full    Dental no notable dental hx.    Pulmonary former smoker,    Pulmonary exam normal        Cardiovascular negative cardio ROS Normal cardiovascular exam     Neuro/Psych MS negative psych ROS   GI/Hepatic negative GI ROS, Neg liver ROS,   Endo/Other  Morbid obesity  Renal/GU negative Renal ROS  negative genitourinary   Musculoskeletal negative musculoskeletal ROS (+)   Abdominal   Peds  Hematology negative hematology ROS (+)   Anesthesia Other Findings Day of surgery medications reviewed with patient.  Reproductive/Obstetrics negative OB ROS                            Anesthesia Physical Anesthesia Plan  ASA: III  Anesthesia Plan: General   Post-op Pain Management:    Induction: Intravenous  PONV Risk Score and Plan: 4 or greater and Treatment may vary due to age or medical condition, Ondansetron, Dexamethasone, Midazolam and Scopolamine patch - Pre-op  Airway Management Planned: Oral ETT  Additional Equipment: None  Intra-op Plan:   Post-operative Plan: Extubation in OR  Informed Consent: I have reviewed the patients History and Physical, chart, labs and discussed the procedure including the risks, benefits and alternatives for the proposed anesthesia with the patient or authorized representative who has indicated his/her understanding and acceptance.     Dental advisory given  Plan Discussed with: CRNA  Anesthesia Plan Comments:        Anesthesia Quick Evaluation

## 2020-05-31 ENCOUNTER — Encounter (HOSPITAL_COMMUNITY)
Admission: RE | Disposition: A | Discharge status: Home / Self Care | Payer: Self-pay | Source: Home / Self Care | Attending: Surgery

## 2020-05-31 ENCOUNTER — Inpatient Hospital Stay (HOSPITAL_COMMUNITY): Payer: BC Managed Care – PPO | Admitting: Physician Assistant

## 2020-05-31 ENCOUNTER — Inpatient Hospital Stay (HOSPITAL_COMMUNITY)
Admission: RE | Admit: 2020-05-31 | Discharge: 2020-06-01 | DRG: 330 | Disposition: A | Discharge status: Home / Self Care | Payer: BC Managed Care – PPO | Attending: Surgery | Admitting: Surgery

## 2020-05-31 ENCOUNTER — Other Ambulatory Visit: Payer: Self-pay

## 2020-05-31 ENCOUNTER — Encounter: Payer: Self-pay | Admitting: Genetic Counselor

## 2020-05-31 ENCOUNTER — Inpatient Hospital Stay (HOSPITAL_COMMUNITY): Payer: BC Managed Care – PPO | Admitting: Anesthesiology

## 2020-05-31 ENCOUNTER — Encounter (HOSPITAL_COMMUNITY): Payer: Self-pay | Admitting: Surgery

## 2020-05-31 DIAGNOSIS — Z6841 Body Mass Index (BMI) 40.0 and over, adult: Secondary | ICD-10-CM

## 2020-05-31 DIAGNOSIS — E785 Hyperlipidemia, unspecified: Secondary | ICD-10-CM | POA: Diagnosis present

## 2020-05-31 DIAGNOSIS — C187 Malignant neoplasm of sigmoid colon: Principal | ICD-10-CM | POA: Diagnosis present

## 2020-05-31 DIAGNOSIS — Z803 Family history of malignant neoplasm of breast: Secondary | ICD-10-CM | POA: Insufficient documentation

## 2020-05-31 DIAGNOSIS — Z87891 Personal history of nicotine dependence: Secondary | ICD-10-CM | POA: Diagnosis not present

## 2020-05-31 DIAGNOSIS — Z8042 Family history of malignant neoplasm of prostate: Secondary | ICD-10-CM | POA: Insufficient documentation

## 2020-05-31 DIAGNOSIS — Z20822 Contact with and (suspected) exposure to covid-19: Secondary | ICD-10-CM | POA: Diagnosis present

## 2020-05-31 DIAGNOSIS — G35 Multiple sclerosis: Secondary | ICD-10-CM | POA: Diagnosis present

## 2020-05-31 DIAGNOSIS — K660 Peritoneal adhesions (postprocedural) (postinfection): Secondary | ICD-10-CM | POA: Diagnosis present

## 2020-05-31 HISTORY — PX: FLEXIBLE SIGMOIDOSCOPY: SHX5431

## 2020-05-31 HISTORY — PX: SIGMOIDECTOMY: SHX176

## 2020-05-31 LAB — TYPE AND SCREEN
ABO/RH(D): O POS
Antibody Screen: NEGATIVE

## 2020-05-31 LAB — ABO/RH: ABO/RH(D): O POS

## 2020-05-31 LAB — PREGNANCY, URINE: Preg Test, Ur: NEGATIVE

## 2020-05-31 SURGERY — COLECTOMY, SIGMOID, ROBOT-ASSISTED
Anesthesia: General

## 2020-05-31 MED ORDER — ENSURE SURGERY PO LIQD
237.0000 mL | Freq: Two times a day (BID) | ORAL | Status: DC
  Administered 2020-06-01 (×2): 237 mL via ORAL

## 2020-05-31 MED ORDER — SODIUM CHLORIDE 0.9 % IV SOLN
2.0000 g | INTRAVENOUS | Status: AC
  Administered 2020-05-31: 2 g via INTRAVENOUS
  Filled 2020-05-31: qty 2

## 2020-05-31 MED ORDER — LIDOCAINE 2% (20 MG/ML) 5 ML SYRINGE
INTRAMUSCULAR | Status: DC | PRN
  Administered 2020-05-31: 100 mg via INTRAVENOUS

## 2020-05-31 MED ORDER — LIDOCAINE 2% (20 MG/ML) 5 ML SYRINGE
INTRAMUSCULAR | Status: DC | PRN
  Administered 2020-05-31: 4 mL/h via INTRAVENOUS

## 2020-05-31 MED ORDER — MIDAZOLAM HCL 5 MG/5ML IJ SOLN
INTRAMUSCULAR | Status: DC | PRN
  Administered 2020-05-31: 2 mg via INTRAVENOUS

## 2020-05-31 MED ORDER — POLYETHYLENE GLYCOL 3350 17 GM/SCOOP PO POWD: 1.0000 | Freq: Once | ORAL | Status: DC

## 2020-05-31 MED ORDER — MIDAZOLAM HCL 2 MG/2ML IJ SOLN
INTRAMUSCULAR | Status: AC
  Filled 2020-05-31: qty 2

## 2020-05-31 MED ORDER — LACTATED RINGERS IV SOLN: INTRAVENOUS | Status: DC

## 2020-05-31 MED ORDER — ALUM & MAG HYDROXIDE-SIMETH 200-200-20 MG/5ML PO SUSP: 30.0000 mL | Freq: Four times a day (QID) | ORAL | Status: DC | PRN

## 2020-05-31 MED ORDER — CHLORHEXIDINE GLUCONATE CLOTH 2 % EX PADS: 6.0000 | MEDICATED_PAD | Freq: Once | CUTANEOUS | Status: DC

## 2020-05-31 MED ORDER — ROCURONIUM BROMIDE 10 MG/ML (PF) SYRINGE
PREFILLED_SYRINGE | INTRAVENOUS | Status: AC
  Filled 2020-05-31: qty 10

## 2020-05-31 MED ORDER — BUPIVACAINE-EPINEPHRINE (PF) 0.25% -1:200000 IJ SOLN
INTRAMUSCULAR | Status: AC
  Filled 2020-05-31: qty 30

## 2020-05-31 MED ORDER — ROCURONIUM BROMIDE 10 MG/ML (PF) SYRINGE
PREFILLED_SYRINGE | INTRAVENOUS | Status: DC | PRN
  Administered 2020-05-31: 10 mg via INTRAVENOUS
  Administered 2020-05-31: 20 mg via INTRAVENOUS
  Administered 2020-05-31: 60 mg via INTRAVENOUS
  Administered 2020-05-31: 10 mg via INTRAVENOUS

## 2020-05-31 MED ORDER — FENTANYL CITRATE (PF) 100 MCG/2ML IJ SOLN: 25.0000 ug | INTRAMUSCULAR | Status: DC | PRN

## 2020-05-31 MED ORDER — PROMETHAZINE HCL 25 MG/ML IJ SOLN
6.2500 mg | INTRAMUSCULAR | Status: DC | PRN
Start: 2020-05-31 — End: 2020-05-31

## 2020-05-31 MED ORDER — FENTANYL CITRATE (PF) 100 MCG/2ML IJ SOLN
INTRAMUSCULAR | Status: AC
  Filled 2020-05-31: qty 2

## 2020-05-31 MED ORDER — SODIUM CHLORIDE 0.9 % IR SOLN
Status: DC | PRN
  Administered 2020-05-31: 1000 mL

## 2020-05-31 MED ORDER — ALVIMOPAN 12 MG PO CAPS
12.0000 mg | ORAL_CAPSULE | Freq: Two times a day (BID) | ORAL | Status: DC
  Administered 2020-06-01: 10:00:00 12 mg via ORAL
  Filled 2020-05-31: qty 1

## 2020-05-31 MED ORDER — EPHEDRINE SULFATE-NACL 50-0.9 MG/10ML-% IV SOSY
PREFILLED_SYRINGE | INTRAVENOUS | Status: DC | PRN
  Administered 2020-05-31 (×3): 10 mg via INTRAVENOUS

## 2020-05-31 MED ORDER — ONDANSETRON HCL 4 MG/2ML IJ SOLN
INTRAMUSCULAR | Status: AC
  Filled 2020-05-31: qty 4

## 2020-05-31 MED ORDER — DIPHENHYDRAMINE HCL 12.5 MG/5ML PO ELIX: 12.5000 mg | ORAL_SOLUTION | Freq: Four times a day (QID) | ORAL | Status: DC | PRN

## 2020-05-31 MED ORDER — DIPHENHYDRAMINE HCL 50 MG/ML IJ SOLN: 12.5000 mg | Freq: Four times a day (QID) | INTRAMUSCULAR | Status: DC | PRN

## 2020-05-31 MED ORDER — HEPARIN SODIUM (PORCINE) 5000 UNIT/ML IJ SOLN
5000.0000 [IU] | Freq: Once | INTRAMUSCULAR | Status: AC
  Administered 2020-05-31: 5000 [IU] via SUBCUTANEOUS
  Filled 2020-05-31: qty 1

## 2020-05-31 MED ORDER — ACETAMINOPHEN 500 MG PO TABS
1000.0000 mg | ORAL_TABLET | ORAL | Status: AC
  Administered 2020-05-31: 1000 mg via ORAL
  Filled 2020-05-31: qty 2

## 2020-05-31 MED ORDER — SUGAMMADEX SODIUM 500 MG/5ML IV SOLN
INTRAVENOUS | Status: AC
  Filled 2020-05-31: qty 10

## 2020-05-31 MED ORDER — FENTANYL CITRATE (PF) 100 MCG/2ML IJ SOLN
INTRAMUSCULAR | Status: DC | PRN
  Administered 2020-05-31: 50 ug via INTRAVENOUS
  Administered 2020-05-31: 100 ug via INTRAVENOUS
  Administered 2020-05-31 (×3): 50 ug via INTRAVENOUS

## 2020-05-31 MED ORDER — SCOPOLAMINE 1 MG/3DAYS TD PT72
1.0000 | MEDICATED_PATCH | Freq: Once | TRANSDERMAL | Status: DC
  Administered 2020-05-31: 1.5 mg via TRANSDERMAL
  Filled 2020-05-31: qty 1

## 2020-05-31 MED ORDER — KETOROLAC TROMETHAMINE 30 MG/ML IJ SOLN
INTRAMUSCULAR | Status: AC
  Filled 2020-05-31: qty 1

## 2020-05-31 MED ORDER — PHENYLEPHRINE 40 MCG/ML (10ML) SYRINGE FOR IV PUSH (FOR BLOOD PRESSURE SUPPORT)
PREFILLED_SYRINGE | INTRAVENOUS | Status: AC
  Filled 2020-05-31: qty 10

## 2020-05-31 MED ORDER — SUGAMMADEX SODIUM 200 MG/2ML IV SOLN
INTRAVENOUS | Status: DC | PRN
  Administered 2020-05-31: 300 mg via INTRAVENOUS

## 2020-05-31 MED ORDER — PROPOFOL 10 MG/ML IV BOLUS
INTRAVENOUS | Status: AC
  Filled 2020-05-31: qty 20

## 2020-05-31 MED ORDER — ENSURE PRE-SURGERY PO LIQD
592.0000 mL | Freq: Once | ORAL | Status: DC
  Filled 2020-05-31: qty 592

## 2020-05-31 MED ORDER — BISACODYL 5 MG PO TBEC: 20.0000 mg | DELAYED_RELEASE_TABLET | Freq: Once | ORAL | Status: DC

## 2020-05-31 MED ORDER — ONDANSETRON HCL 4 MG PO TABS: 4.0000 mg | ORAL_TABLET | Freq: Four times a day (QID) | ORAL | Status: DC | PRN

## 2020-05-31 MED ORDER — CHLORHEXIDINE GLUCONATE 0.12 % MT SOLN
15.0000 mL | Freq: Once | OROMUCOSAL | Status: AC
  Administered 2020-05-31: 15 mL via OROMUCOSAL

## 2020-05-31 MED ORDER — OXYCODONE HCL 5 MG/5ML PO SOLN: 5.0000 mg | Freq: Once | ORAL | Status: DC | PRN

## 2020-05-31 MED ORDER — PHENYLEPHRINE 40 MCG/ML (10ML) SYRINGE FOR IV PUSH (FOR BLOOD PRESSURE SUPPORT)
PREFILLED_SYRINGE | INTRAVENOUS | Status: AC
  Filled 2020-05-31: qty 20

## 2020-05-31 MED ORDER — ONDANSETRON HCL 4 MG/2ML IJ SOLN: 4.0000 mg | Freq: Four times a day (QID) | INTRAMUSCULAR | Status: DC | PRN

## 2020-05-31 MED ORDER — METRONIDAZOLE 500 MG PO TABS: 1000.0000 mg | ORAL_TABLET | ORAL | Status: DC

## 2020-05-31 MED ORDER — HEPARIN SODIUM (PORCINE) 5000 UNIT/ML IJ SOLN
5000.0000 [IU] | Freq: Three times a day (TID) | INTRAMUSCULAR | Status: DC
  Administered 2020-05-31 – 2020-06-01 (×3): 5000 [IU] via SUBCUTANEOUS
  Filled 2020-05-31 (×3): qty 1

## 2020-05-31 MED ORDER — OXYCODONE HCL 5 MG PO TABS: 5.0000 mg | ORAL_TABLET | Freq: Once | ORAL | Status: DC | PRN

## 2020-05-31 MED ORDER — FENTANYL CITRATE (PF) 250 MCG/5ML IJ SOLN
INTRAMUSCULAR | Status: AC
  Filled 2020-05-31: qty 5

## 2020-05-31 MED ORDER — DEXAMETHASONE SODIUM PHOSPHATE 10 MG/ML IJ SOLN
INTRAMUSCULAR | Status: AC
  Filled 2020-05-31: qty 2

## 2020-05-31 MED ORDER — INDOCYANINE GREEN 25 MG IV SOLR
INTRAVENOUS | Status: DC | PRN
  Administered 2020-05-31: 7.5 mg via INTRAVENOUS

## 2020-05-31 MED ORDER — HYDROMORPHONE HCL 1 MG/ML IJ SOLN: 0.5000 mg | INTRAMUSCULAR | Status: DC | PRN

## 2020-05-31 MED ORDER — DEXAMETHASONE SODIUM PHOSPHATE 10 MG/ML IJ SOLN
INTRAMUSCULAR | Status: DC | PRN
  Administered 2020-05-31: 5 mg via INTRAVENOUS

## 2020-05-31 MED ORDER — NEOMYCIN SULFATE 500 MG PO TABS: 1000.0000 mg | ORAL_TABLET | ORAL | Status: DC

## 2020-05-31 MED ORDER — PROPOFOL 10 MG/ML IV BOLUS
INTRAVENOUS | Status: DC | PRN
  Administered 2020-05-31: 40 mg via INTRAVENOUS
  Administered 2020-05-31: 200 mg via INTRAVENOUS

## 2020-05-31 MED ORDER — IBUPROFEN 400 MG PO TABS
600.0000 mg | ORAL_TABLET | Freq: Four times a day (QID) | ORAL | Status: DC | PRN
Start: 2020-05-31 — End: 2020-06-01
  Administered 2020-06-01: 12:00:00 600 mg via ORAL
  Filled 2020-05-31: qty 1

## 2020-05-31 MED ORDER — ONDANSETRON HCL 4 MG/2ML IJ SOLN
INTRAMUSCULAR | Status: AC
  Filled 2020-05-31: qty 2

## 2020-05-31 MED ORDER — ENSURE PRE-SURGERY PO LIQD
296.0000 mL | Freq: Once | ORAL | Status: DC
  Filled 2020-05-31: qty 296

## 2020-05-31 MED ORDER — SIMETHICONE 80 MG PO CHEW: 40.0000 mg | CHEWABLE_TABLET | Freq: Four times a day (QID) | ORAL | Status: DC | PRN

## 2020-05-31 MED ORDER — KETAMINE HCL 10 MG/ML IJ SOLN
INTRAMUSCULAR | Status: DC | PRN
  Administered 2020-05-31: 30 mg via INTRAVENOUS

## 2020-05-31 MED ORDER — ONDANSETRON HCL 4 MG/2ML IJ SOLN
INTRAMUSCULAR | Status: DC | PRN
  Administered 2020-05-31: 4 mg via INTRAVENOUS

## 2020-05-31 MED ORDER — ALVIMOPAN 12 MG PO CAPS
12.0000 mg | ORAL_CAPSULE | ORAL | Status: AC
  Administered 2020-05-31: 12 mg via ORAL
  Filled 2020-05-31: qty 1

## 2020-05-31 MED ORDER — ACETAMINOPHEN 500 MG PO TABS
1000.0000 mg | ORAL_TABLET | Freq: Four times a day (QID) | ORAL | Status: DC
  Administered 2020-05-31 – 2020-06-01 (×3): 1000 mg via ORAL
  Filled 2020-05-31 (×3): qty 2

## 2020-05-31 MED ORDER — ORAL CARE MOUTH RINSE: 15.0000 mL | Freq: Once | OROMUCOSAL | Status: AC

## 2020-05-31 MED ORDER — TRAMADOL HCL 50 MG PO TABS
50.0000 mg | ORAL_TABLET | Freq: Four times a day (QID) | ORAL | Status: DC | PRN
Start: 2020-05-31 — End: 2020-06-01

## 2020-05-31 SURGICAL SUPPLY — 110 items
APPLIER CLIP 5 13 M/L LIGAMAX5 (MISCELLANEOUS)
APPLIER CLIP ROT 10 11.4 M/L (STAPLE)
BLADE EXTENDED COATED 6.5IN (ELECTRODE) ×2 IMPLANT
CANNULA REDUC XI 12-8 STAPL (CANNULA) ×1
CANNULA REDUCER 12-8 DVNC XI (CANNULA) ×1 IMPLANT
CELLS DAT CNTRL 66122 CELL SVR (MISCELLANEOUS) IMPLANT
CHLORAPREP W/TINT 26 (MISCELLANEOUS) ×2 IMPLANT
CLIP APPLIE 5 13 M/L LIGAMAX5 (MISCELLANEOUS) IMPLANT
CLIP APPLIE ROT 10 11.4 M/L (STAPLE) IMPLANT
CLIP VESOLOCK LG 6/CT PURPLE (CLIP) IMPLANT
CLIP VESOLOCK MED LG 6/CT (CLIP) IMPLANT
COVER SURGICAL LIGHT HANDLE (MISCELLANEOUS) ×4 IMPLANT
COVER TIP SHEARS 8 DVNC (MISCELLANEOUS) ×1 IMPLANT
COVER TIP SHEARS 8MM DA VINCI (MISCELLANEOUS) ×1
COVER WAND RF STERILE (DRAPES) IMPLANT
DECANTER SPIKE VIAL GLASS SM (MISCELLANEOUS) ×2 IMPLANT
DERMABOND ADVANCED (GAUZE/BANDAGES/DRESSINGS) ×1
DERMABOND ADVANCED .7 DNX12 (GAUZE/BANDAGES/DRESSINGS) ×1 IMPLANT
DEVICE TROCAR PUNCTURE CLOSURE (ENDOMECHANICALS) IMPLANT
DRAIN CHANNEL 19F RND (DRAIN) ×2 IMPLANT
DRAPE ARM DVNC X/XI (DISPOSABLE) ×4 IMPLANT
DRAPE COLUMN DVNC XI (DISPOSABLE) ×1 IMPLANT
DRAPE DA VINCI XI ARM (DISPOSABLE) ×4
DRAPE DA VINCI XI COLUMN (DISPOSABLE) ×1
DRAPE SURG IRRIG POUCH 19X23 (DRAPES) ×2 IMPLANT
DRSG OPSITE POSTOP 4X10 (GAUZE/BANDAGES/DRESSINGS) IMPLANT
DRSG OPSITE POSTOP 4X6 (GAUZE/BANDAGES/DRESSINGS) ×2 IMPLANT
DRSG OPSITE POSTOP 4X8 (GAUZE/BANDAGES/DRESSINGS) IMPLANT
DRSG TEGADERM 2-3/8X2-3/4 SM (GAUZE/BANDAGES/DRESSINGS) ×10 IMPLANT
DRSG TEGADERM 4X4.75 (GAUZE/BANDAGES/DRESSINGS) ×2 IMPLANT
ELECT REM PT RETURN 15FT ADLT (MISCELLANEOUS) ×2 IMPLANT
ENDOLOOP SUT PDS II  0 18 (SUTURE)
ENDOLOOP SUT PDS II 0 18 (SUTURE) IMPLANT
EVACUATOR SILICONE 100CC (DRAIN) ×2 IMPLANT
GAUZE SPONGE 2X2 8PLY STRL LF (GAUZE/BANDAGES/DRESSINGS) ×1 IMPLANT
GAUZE SPONGE 4X4 12PLY STRL (GAUZE/BANDAGES/DRESSINGS) IMPLANT
GLOVE BIO SURGEON STRL SZ7.5 (GLOVE) ×6 IMPLANT
GLOVE INDICATOR 8.0 STRL GRN (GLOVE) ×6 IMPLANT
GOWN STRL REUS W/TWL XL LVL3 (GOWN DISPOSABLE) ×10 IMPLANT
GRASPER SUT TROCAR 14GX15 (MISCELLANEOUS) IMPLANT
HOLDER FOLEY CATH W/STRAP (MISCELLANEOUS) ×2 IMPLANT
KIT PROCEDURE DA VINCI SI (MISCELLANEOUS) ×1
KIT PROCEDURE DVNC SI (MISCELLANEOUS) ×1 IMPLANT
KIT TURNOVER KIT A (KITS) IMPLANT
NEEDLE INSUFFLATION 14GA 120MM (NEEDLE) ×2 IMPLANT
PACK CARDIOVASCULAR III (CUSTOM PROCEDURE TRAY) ×2 IMPLANT
PACK COLON (CUSTOM PROCEDURE TRAY) ×2 IMPLANT
PAD POSITIONING PINK XL (MISCELLANEOUS) ×2 IMPLANT
PENCIL SMOKE EVACUATOR (MISCELLANEOUS) IMPLANT
PORT LAP GEL ALEXIS MED 5-9CM (MISCELLANEOUS) ×2 IMPLANT
PROTECTOR NERVE ULNAR (MISCELLANEOUS) ×4 IMPLANT
RELOAD STAPLER 3.5X45 BLU DVNC (STAPLE) IMPLANT
RELOAD STAPLER 3.5X60 BLU DVNC (STAPLE) IMPLANT
RELOAD STAPLER 4.3X45 GRN DVNC (STAPLE) IMPLANT
RELOAD STAPLER 4.3X60 GRN DVNC (STAPLE) ×2 IMPLANT
RTRCTR WOUND ALEXIS 18CM MED (MISCELLANEOUS)
SCISSORS LAP 5X35 DISP (ENDOMECHANICALS) ×2 IMPLANT
SEAL CANN UNIV 5-8 DVNC XI (MISCELLANEOUS) ×4 IMPLANT
SEAL XI 5MM-8MM UNIVERSAL (MISCELLANEOUS) ×4
SEALER VESSEL DA VINCI XI (MISCELLANEOUS) ×1
SEALER VESSEL EXT DVNC XI (MISCELLANEOUS) ×1 IMPLANT
SET IRRIG TUBING LAPAROSCOPIC (IRRIGATION / IRRIGATOR) ×2 IMPLANT
SLEEVE ADV FIXATION 5X100MM (TROCAR) IMPLANT
SOLUTION ELECTROLUBE (MISCELLANEOUS) ×2 IMPLANT
SPONGE GAUZE 2X2 STER 10/PKG (GAUZE/BANDAGES/DRESSINGS) ×1
STAPLER 60 DA VINCI SURE FORM (STAPLE) ×1
STAPLER 60 SUREFORM DVNC (STAPLE) ×1 IMPLANT
STAPLER CANNULA SEAL DVNC XI (STAPLE) ×1 IMPLANT
STAPLER CANNULA SEAL XI (STAPLE) ×1
STAPLER ECHELON POWER CIR 29 (STAPLE) ×2 IMPLANT
STAPLER ECHELON POWER CIR 31 (STAPLE) IMPLANT
STAPLER RELOAD 3.5X45 BLU DVNC (STAPLE)
STAPLER RELOAD 3.5X45 BLUE (STAPLE)
STAPLER RELOAD 3.5X60 BLU DVNC (STAPLE)
STAPLER RELOAD 3.5X60 BLUE (STAPLE)
STAPLER RELOAD 4.3X45 GREEN (STAPLE)
STAPLER RELOAD 4.3X45 GRN DVNC (STAPLE)
STAPLER RELOAD 4.3X60 GREEN (STAPLE) ×2
STAPLER RELOAD 4.3X60 GRN DVNC (STAPLE) ×2
STAPLER SHEATH (SHEATH)
STAPLER SHEATH ENDOWRIST DVNC (SHEATH) IMPLANT
STOPCOCK 4 WAY LG BORE MALE ST (IV SETS) ×4 IMPLANT
SURGILUBE 2OZ TUBE FLIPTOP (MISCELLANEOUS) ×2 IMPLANT
SUT MNCRL AB 4-0 PS2 18 (SUTURE) ×2 IMPLANT
SUT PDS AB 1 CT1 27 (SUTURE) IMPLANT
SUT PDS AB 1 TP1 96 (SUTURE) IMPLANT
SUT PROLENE 0 CT 2 (SUTURE) IMPLANT
SUT PROLENE 2 0 KS (SUTURE) ×2 IMPLANT
SUT PROLENE 2 0 SH DA (SUTURE) IMPLANT
SUT SILK 2 0 (SUTURE)
SUT SILK 2 0 SH CR/8 (SUTURE) IMPLANT
SUT SILK 2-0 18XBRD TIE 12 (SUTURE) IMPLANT
SUT SILK 3 0 (SUTURE) ×1
SUT SILK 3 0 SH CR/8 (SUTURE) ×2 IMPLANT
SUT SILK 3-0 18XBRD TIE 12 (SUTURE) ×1 IMPLANT
SUT V-LOC BARB 180 2/0GR6 GS22 (SUTURE)
SUT VIC AB 3-0 SH 18 (SUTURE) IMPLANT
SUT VIC AB 3-0 SH 27 (SUTURE)
SUT VIC AB 3-0 SH 27XBRD (SUTURE) IMPLANT
SUT VICRYL 0 UR6 27IN ABS (SUTURE) ×2 IMPLANT
SUTURE V-LC BRB 180 2/0GR6GS22 (SUTURE) IMPLANT
SYR 10ML LL (SYRINGE) ×2 IMPLANT
SYS LAPSCP GELPORT 120MM (MISCELLANEOUS)
SYSTEM LAPSCP GELPORT 120MM (MISCELLANEOUS) IMPLANT
TAPE UMBILICAL COTTON 1/8X30 (MISCELLANEOUS) ×2 IMPLANT
TOWEL OR NON WOVEN STRL DISP B (DISPOSABLE) ×2 IMPLANT
TRAY FOLEY MTR SLVR 16FR STAT (SET/KITS/TRAYS/PACK) ×2 IMPLANT
TROCAR ADV FIXATION 5X100MM (TROCAR) ×2 IMPLANT
TUBING CONNECTING 10 (TUBING) ×4 IMPLANT
TUBING INSUFFLATION 10FT LAP (TUBING) ×2 IMPLANT

## 2020-05-31 NOTE — Interval H&P Note (Signed)
History and Physical Interval Note:  05/31/2020 8:09 AM  Elizabeth Dixon  has presented today for surgery, with the diagnosis of COLON CANCER SIGMOID.  The various methods of treatment have been discussed with the patient and family. After consideration of risks, benefits and other options for treatment, the patient has consented to  Procedure(s): XI ROBOT ASSISTED SIGMOID COLECTOMY WITH COLORECTAL ANASTOMOSIS (N/A) FLEXIBLE SIGMOIDOSCOPY, ICG INTRAOP ASSESSMENT OF PERFUSION (N/A) as a surgical intervention.  The patient's history has been reviewed, she has been examined and reports no change in status, she states she is ready for surgery.  We spent time reviewing the planned procedure, material risks, benefits and alternatives. Her questions were answered to her satisfaction. We reviewed typical postoperative expectations. She is has elected to proceed with surgery  Nadeen Landau, MD [facility]

## 2020-05-31 NOTE — Anesthesia Procedure Notes (Signed)
Procedure Name: Intubation Performed by: Jourdon Zimmerle J, CRNA Pre-anesthesia Checklist: Patient identified, Emergency Drugs available, Suction available, Patient being monitored and Timeout performed Patient Re-evaluated:Patient Re-evaluated prior to induction Oxygen Delivery Method: Circle system utilized Preoxygenation: Pre-oxygenation with 100% oxygen Induction Type: IV induction Ventilation: Mask ventilation without difficulty Laryngoscope Size: Mac and 4 Grade View: Grade II Tube type: Oral Tube size: 7.0 mm Number of attempts: 1 Airway Equipment and Method: Stylet Placement Confirmation: ETT inserted through vocal cords under direct vision,  positive ETCO2 and breath sounds checked- equal and bilateral Secured at: 21 cm Tube secured with: Tape Dental Injury: Teeth and Oropharynx as per pre-operative assessment        

## 2020-05-31 NOTE — Op Note (Signed)
PATIENT: Elizabeth Dixon  45 y.o. female  Patient Care Team: [name], NP as PCP - General (Internal Medicine)  PREOP DIAGNOSIS: COLON CANCER SIGMOID  POSTOP DIAGNOSIS: COLON CANCER SIGMOID  PROCEDURE: 1. Robotic sigmoidectomy with double stapled colorectal anastomosis 2. Intraoperative assessment of perfusion (indocyanin green) 3. Flexible sigmoidoscopy 4. Bilateral transversus abdominus plane blocks  SURGEON: Sharon Mt. Dema Severin, MD  ASSISTANT: Leighton Ruff, MD  ANESTHESIA: General endotracheal  EBL: 50 cc Total I/O In: 1600 [I.V.:1500; IV Piggyback:100] Out: 200 [Urine:150; Blood:50]  DRAINS: None  SPECIMEN: 1. Rectosigmoid colon - open end proximal 2. Proximal donut 3. Distal donut  COUNTS: Sponge, needle and instrument counts were reported correct x2  FINDINGS:  No evident metastatic disease on visceral parietal peritoneum or liver. Tattoo identified in mid sigmoid - both proximal and distal tattoos were appreciated. A tension free, well perfused, air tight 29 mm EEA colorectal anastomosis fashioned 18 cm from the anal verge by flexible sigmoidoscopy.  NARRATIVE: Informed consent was verified. She was taken to the operating room, placed supine on the operating table and SCD's were applied. General endotracheal anesthesia was induced without difficulty. She was then positioned in the lithotomy position with Allen stirrups.  A foley catheter was then placed by nursing under sterile conditions. Hair on the abdomen was clipped. Pressure points were then evaluated and padded.  The abdomen was then prepped and draped in the standard sterile fashion. Surgical timeout was called indicating the correct patient, procedure, positioning and need for preoperative antibiotics.   An OG tube was placed by anesthesia and confirmed to be to suction.  At Palmer's point, a stab incision was created and the Veress needle was introduced into the peritoneal cavity on the first attempt.   Intraperitoneal location was confirmed by the aspiration and saline drop test.  Pneumoperitoneum was established to a maximum pressure of 15 mmHg using CO2.  Following this, the abdomen was marked for planned trocar sites.  Just to the right and cephalad to the umbilicus, an 8 mm incision was created and an 8 mm blunt tipped robotic trocar was cautiously placed into the peritoneal cavity.  The laparoscope was inserted and demonstrated no evidence of trocar site nor Veress needle site complications.  The Veress needle was removed.  Bilateral transversus abdominis plane blocks were then created using a dilute mixture of Exparel with Marcaine.  3 additional 8 mm robotic trochars were placed under direct visualization roughly in a line extending from the right ASIS towards the left upper quadrant. The bladder was inspected and noted to be at/below the pubic symphysis.  Staying 3 fingerbreadths above the pubic symphysis, an incision was created and the 12 mm robotic trocar inserted directed cephalad into the peritoneal cavity under direct visualization.  An additional 5 mm assist port was placed in the right lateral abdomen under direct visualization.  The abdomen was surveyed and there was 2 evident tattoos in the mid sigmoid colon with 4 cm of normal appearing colon between consistent with her endoscopic history.  There was also an omental adhesion in the midline infraumbilcial position likely related to prior c-section and adhesion postoperatively. This was taken down to facilitate the procedure. The fascia was inspected and palpated and no hernia was found at this location. She was positioned in Trendelenburg with some left side up.  Small bowel was carefully retracted out of the pelvis.  The robot was then docked and I went to the console.   The sigmoid colon was readily identified and  healthy in appearance.  Attachments of the sigmoid colon were taken down from the intersigmoid fossa.  The rectosigmoid colon was  grasped and elevated anteriorly.  Beginning with a medial to lateral approach, the peritoneum overlying the presacral space was carefully incised.  The TME plane was readily gained working in a plane between the fascia propria of the rectum and the presacral fascia.  Hypogastric nerves were seen going along the the presacral fascia and were protected free of injury.  Working more proximally, the mesorectum and sigmoid mesentery were carefully mobilized off of the peritoneum.  The left ureter was identified and protected free of injury.  The left gonadal vessels were identified and protected.  These were both swept "down."  The superior hemorrhoidal and IMA pedicles were identified. Further mesocolon was mobilized proximally staying in this plane between the retroperitoneum proper and the mesocolon. Attention was then turned to the lateral portion of dissection.  The sigmoid colon was retracted to the right.  The sigmoid colon was fully mobilized by incising the Whitnee Orzel line of Toldt. The descending colon and mesentery were mobilized up to the splenic flexure.  The left ureter again was confirmed to be well away for plan dissection and well away from the vasculature which had been dissected medially.  The rectosigmoid colon was elevated anteriorly. The left ureter was re-identified. The IMA was clear of this. The IMA was then divided with the vessel sealer. The stump was inspected and noted to be completely hemostatic with a good seal.  The mesentery was divided out to the point of planned proximal division working just above the IMA for oncologic purposes.  Working more distally, the rectum was identified where the tinea had splayed and there were loss of appendices epiploica.  This also corresponded to a location overlying the sacral promontory.  Anatomically, this clearly represents the proximal rectum.  The mesentery out to this level was then cleared using the vessel sealer. The distal point of transection on  the proximal rectum was identified.   A 65 mm green load robotic stapler was then placed through the 12 mm port and introduced into the peritoneal cavity.  The rectum was divided with a single firing of the stapler.  The stump was intact and healthy in appearance.   Attention was turned to performing a perfusion test. ICG was administered by anesthesia and at the level of the cleared mesentery proximally, there was excellent uptake of the tracer.  The rectum was also well perfused in appearance. The planned proximal division point reached into the pelvis without any difficulty and remained in that location without any tension. A locking grasper was then placed on the sigmoid staple line.   Attention was turned to the extracorporeal portion of the procedure.  The robot was undocked.  I scrubbed back in.  Using the 12 mm trocar site, a Pfannenstiel incision was created and incorporated the fascial opening through the 12 mm port site.  The rectus fascia was incised and then elevated.  The rectus muscle was mobilized free of the overlying fascia.  The peritoneum was incised in the midline well above the location of the bladder.  An Daisy wound protector was placed.  Towels were placed around the field.  The divided colon was passed through the wound protector.  The point of proximal division was identified and was again on a healthy segment of supple colon with a palpable pulse in the mesentery. This was pink in color.  A pursestring device was  applied.  A 2-0 Prolene on a Keith needle was passed.  The colon was divided and passed off with the open end being proximal.  EEA sizers were then introduced and a 29 mm EEA selected.  "Belt loops" consisting of 3-0 silk were placed around the pursestring suture line.  The anvil was placed and the pursestring tied.  A small amount of fat was cleared from the planned anastomosis and no diverticula were apparent within this.  This was placed back into the abdomen and a cap  placed over the wound protector port site.  Pneumoperitoneum was reestablished.  I then went below to pass the stapler.  My partner remained above.  EEA sizers were cautiously passed trans-anally under direct visualization.  The stapler was passed and the spike deployed just anterior to the staple line.  The components were then mated.  Orientation was confirmed such that there is no twisting of the colon nor small bowel underneath the mesenteric defect. Care was taken to ensure no other structures were incorporated within this either.  The stapler was then closed, held, and fired. This was then removed. The donuts were inspected and noted to be complete and additionally included as specimens.  The colon proximal to the anastomosis was then gently occluded. The pelvis was filled with sterile irrigation. Under direct visualization, I passed a flexible sigmoidoscope.  The anastomosis was under water.  With good distention of the anastomosis there was no air leak. The anastomosis pink in appearance.  This is located at 18 cm from the anal verge by flexible sigmoidoscopy.  It is hemostatic.  Additionally, looking from above, there is no tension on the colon or mesentery.  Sigmoidoscope was withdrawn.  Irrigation was evacuated from the pelvis.  The abdomen and pelvis are surveyed and noted to be completely hemostatic without any apparent injury.  Under direct visualization, all trochars are removed.  The [place] wound protector was removed.  Gowns/gloves are changed and a fresh set of clean instruments utilized. Additional sterile drapes were placed around the field.   Sponge, needle, and instrument counts were reported correct.  The Pfannenstiel peritoneum was closed with a running 2-0 Vicryl suture.  The rectus fascia was then closed using 2 running #1 PDS sutures.  The fascia was then palpated and noted to be completely closed.  Additional anesthetic was infiltrated at the Pfannenstiel site.  Sponge, needle, and  instrument counts were then again reported correct. 4-0 Monocryl subcuticular suture was used to close the skin of all incision sites.  Dermabond was placed over all incisions.  A honeycomb dressing placed over the Pfannenstiel as well.   She was then taken out of lithotomy, awakened from anesthesia, extubated, and transferred to a stretcher for transport to PACU in satisfactory condition having tolerated the procedure well.

## 2020-05-31 NOTE — Progress Notes (Signed)
REFERRING PROVIDER: Ileana Roup, MD [place],  [place] [address]  PRIMARY PROVIDER:  [name], NP  PRIMARY REASON FOR VISIT:  1. Ductal carcinoma in situ of left breast   2. Malignant neoplasm of sigmoid colon ([place])   3. Family history of prostate cancer   4. Family history of breast cancer      I connected with Ms. Skorupski on 05/29/2020 at 9:00 am EDT by video conference and verified that I am speaking with the correct person using two identifiers.   Patient location: Home Provider location: [place] Office  HISTORY OF PRESENT ILLNESS:   Ms. Basurto, a 45 y.o. female, was seen for a [place] cancer genetics consultation at the request of Dr. Dema Severin due to a personal and family history of cancer.  Ms. Gibby presents to clinic today to discuss the possibility of a hereditary predisposition to cancer, genetic testing, and to further clarify her future cancer risks, as well as potential cancer risks for family members.   In 2016, at the age of 7, Ms. Santizo was diagnosed with ductal carcinoma in situ of the left breast. The tumor was ER-/PR+. The treatment plan included lumpectomy and radiation therapy.   At the time of her breast cancer diagnosis in 2016, she underwent genetic testing through the [facility] Common Hereditary Cancers 42-gene panel, which was negative. This test included analysis of the following 42 genes: APC, ATM, AXIN2, BARD1, BMPR1A, BRCA1, BRCA2, BRIP1, CDH1, CDKN2A, CHEK2, DICER1, EPCAM, GREM1, KIT, MEN1, MLH1, MSH2, MSH6, MUTYH, NBN, NF1, PALB2, PDGFRA, PMS2, POLD1, POLE, PTEN, RAD50, RAD51C, RAD51D, SDHA, SDHB, SDHC, SDHD, SMAD4, SMARCA4, STK11, TP53, TSC1, TSC2, VHL.   More recently, in December of 2021, Ms. Fjeld was diagnosed with adenocarcinoma of a distal sigmoid colon polyp. The tumor had normal mismatch repair protein IHC expression. She is scheduled to have a sigmoid colectomy with Dr. Dema Severin on 05/31/2020.  RISK FACTORS:   Menarche was at age 1.  First live birth at age 52.  OCP use for approximately 10-13 years.  Ovaries intact: yes.  Hysterectomy: no.  Menopausal status: premenopausal.  HRT use: 0 years. Colonoscopy: yes; abnormal. Mammogram within the last year: yes. Any excessive radiation exposure in the past: yes - radiation therapy for left breast cancer in 2016.   Past Medical History:  Diagnosis Date  . Anemia   . Breast cancer ([place]) 2016   left breast- ductal carcinoma in situ  . Family history of breast cancer   . Family history of prostate cancer   . Hyperlipidemia    hx of  . Iron deficiency anemia    hx of  . Multiple sclerosis ([place])    dx 2002  . Neuromuscular disorder [facility])     Past Surgical History:  Procedure Laterality Date  . BREAST LUMPECTOMY Left 08/2014  . BREAST REDUCTION SURGERY Bilateral 08/2014  . CESAREAN SECTION  2013  . VAGINA SURGERY     vagina tear from childbirth  . WISDOM TOOTH EXTRACTION      Social History   Socioeconomic History  . Marital status: Married    Spouse name: Not on file  . Number of children: Not on file  . Years of education: Not on file  . Highest education level: Not on file  Occupational History  . 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  Tobacco Use  . Smoking status: Former Smoker    Types: Cigarettes    Quit date: 2006    Years  since quitting: 16.1  . Smokeless tobacco: Never Used  Vaping Use  . Vaping Use: Never used  Substance and Sexual Activity  . Alcohol use: Yes    Alcohol/week: 1.0 standard drink    Types: 1 Standard drinks or equivalent per week    Comment: once a wk  . Drug use: Never  . Sexual activity: Yes    Partners: Male  Other Topics Concern  . Not on file  Social History Narrative   Works as an Geologist, engineering   Married-  Children 2013 son and 2011 daughter   Moved from [place]   Enjoys children's sports   Enjoys outside activities   dog   Complete bachelors degree      Right handed   Social Determinants of  Health   Financial Resource Strain: Not on file  Food Insecurity: Not on file  Transportation Needs: Not on file  Physical Activity: Not on file  Stress: Not on file  Social Connections: Not on file     FAMILY HISTORY:  We obtained a detailed, 4-generation family history.  Significant diagnoses are listed below: Family History  Problem Relation Age of Onset  . Thyroid disease Mother   . Hypertension Father   . Prostate cancer Father 75  . Skin cancer Father 58  . Parkinson's disease Paternal Grandmother   . Breast cancer Other        unconfirmed diagnosis, great-aunt (MGF's sister)  . Colon cancer Neg Hx   . Colon polyps Neg Hx   . Esophageal cancer Neg Hx   . Stomach cancer Neg Hx   . Rectal cancer Neg Hx    Ms. Witkop has one daughter (age 59) and one son (age 19). She has two brothers (ages 50 and 61) and one sister (age 98). None of these relatives have had cancer.  Ms. Espino mother is alive at age 15 without cancer. There were two maternal aunts and one maternal uncle. There is no known cancer among maternal aunts/uncles or maternal cousins. Ms. Schecter maternal grandmother died at age 84 without cancer. Her maternal grandfather died at age 5 without cancer. She notes that a great-aunt (PGF's sister) may have had breast cancer.  Ms. Southard father is alive at age 66 and has a history of prostate cancer (diagnosed in his 72s) and skin cancer (type unknown, diagnosed around age 77). There are two paternal aunts and three paternal uncles. There is no known cancer among paternal aunts/uncles or paternal cousins. Ms. Lukach paternal grandparents died in their 42s without cancer.   Ms. Lundeen is unaware of previous family history of genetic testing for hereditary cancer risks. Patient's maternal ancestors are of [place] and [place] descent, and paternal ancestors are of [place] and [place] descent. There is no reported Ashkenazi Jewish ancestry. There is no known  consanguinity.  CANCER GENETICS DISCUSSION:  We discussed that, in general, most cancer is not inherited in families, but instead is sporadic or familial. Sporadic cancers occur by chance and typically happen at older ages (>50 years) as this type of cancer is caused by genetic changes acquired during an individual's lifetime. Some families have more cancers than would be expected by chance; however, the ages or types of cancer are not consistent with a known genetic mutation or known genetic mutations have been ruled out. This type of familial cancer is thought to be due to a combination of multiple genetic, environmental, hormonal, and lifestyle factors. While this combination of factors likely increases the  risk of cancer, the exact source of this risk is not currently identifiable or testable.    We discussed that approximately 5-10% of cancer is hereditary, meaning that it is due to a mutation in a single gene that is passed down from generation to generation in a family. Most hereditary cases of breast cancer are associated with the BRCA1 and BRCA2 genes. Most hereditary cases of colon cancer are associated with the Lynch syndrome genes. There are other genes that can be associated an increased risk for breast and/or colon cancer, including ATM, CHEK2, PALB2, APC, MUTYH, etc. We discussed that identifying a hereditary cancer syndrome can be beneficial for several reasons, including knowing about other cancer risks, identifying potential screening and risk-reduction options that may be appropriate, and to understand if other family members could be at risk for cancer and allow them to undergo genetic testing.   PREVIOUS GENETIC TESTING:  Ms. Betten had a fairly comprehensive genetic test in 2016 called the [facility] Common Hereditary Cancers panel, which evaluated 42 genes associated with hereditary cancer syndromes, including hereditary breast cancer syndromes and hereditary colon cancer syndromes. We are  attempting to obtain a copy of this report. If she is interested, Ms. Fowers may pursue repeat genetic testing with the addition of RNA testing. RNA testing has demonstrated the ability to detect hereditary pathogenic variants that may otherwise be missed in DNA-only testing in a small percentage of patients. There are no guidelines that recommend repeat testing with RNA studies for individuals with otherwise comprehensive testing; therefore, this additional testing may or may not be covered by insurance. Ms. Paternostro considered this option and declined further testing at this time.  We discussed with Ms. Hargan that because current genetic testing is not perfect, it is possible there may be a gene mutation in one of the previously tested genes that current testing cannot detect, but that chance is small. We also discussed that there could be another gene that has not yet been discovered, or that we have not yet tested, that is responsible for the cancer diagnoses in the family. Therefore, it is important to remain in touch with cancer genetics in the future so that we can continue to offer Ms. Guillen the most up to date genetic testing.   CANCER SCREENING RECOMMENDATIONS: Ms. Ziolkowski test result is considered negative (normal). This means that we have not identified a hereditary cause for her personal and family history of cancer at this time. While reassuring, this does not definitively rule out a hereditary predisposition to cancer. It is still possible that there could be genetic mutations that are undetectable by current technology. There could be genetic mutations in genes that have not been tested or identified to increase cancer risk. Therefore, it is recommended she continue to follow the cancer management and screening guidelines provided by her oncology and primary healthcare provider.   An individual's cancer risk and medical management are not determined by genetic test results alone. Overall cancer risk  assessment incorporates additional factors, including personal medical history, family history, and any available genetic information that may result in a personalized plan for cancer prevention and surveillance.  Her negative genetic test simply tells [place] that we cannot yet define why Ms. Pettaway has had colorectal cancer at a young age. Ms. Gomez's medical management and screening should be based on the prospect that she may be at an increased risk for a second colorectal cancer in the future and should, therefore, undergo more frequent colonoscopy screening at  intervals determined by her GI providers.  RECOMMENDATIONS FOR FAMILY MEMBERS:  Individuals in this family might be at some increased risk of developing cancer, over the general population risk, simply due to the family history of cancer.  We recommended women in this family have a yearly mammogram beginning 10 years younger than the earliest onset of breast cancer, an annual clinical breast exam, and perform monthly breast self-exams. Women in this family should also have a gynecological exam as recommended by their primary provider. All family members should be referred for colonoscopy starting at age 47.  Per the NCCN Guidelines (Colorectal Cancer Screening Guidelines, Version 1.2021), all of Ms. Fasnacht's first-degree relatives should have a colonoscopy every 5 years, or as determined by their GI doctors, beginning 10 years younger than the earliest diagnosis of colorectal cancer in the family.  FOLLOW-UP: Lastly, we discussed with Ms. Castiglia that cancer genetics is a rapidly advancing field and it is possible that new genetic tests will be appropriate for her and/or her family members in the future. We encouraged her to remain in contact with cancer genetics on an annual basis so we can update her personal and family histories and let her know of advances in cancer genetics that may benefit this family.   Our contact number was provided. Ms. Guimaraes  questions were answered to her satisfaction, and she knows she is welcome to call us at anytime with additional questions or concerns.    Clint Guy, [place], [facility] Licensed, Certified Dispensing [email or web address] Phone: [phone]  The patient was seen for a total of 35 minutes in face-to-face genetic counseling.  This patient was discussed with Drs. Magrinat, Lindi Adie and/or Burr Medico who agrees with the above.    _______________________________________________________________________ For Office Staff:  Number of people involved in session: 1 Was an Intern/ student involved with case: no

## 2020-05-31 NOTE — Transfer of Care (Signed)
Immediate Anesthesia Transfer of Care Note  Patient: Elizabeth Dixon  Procedure(s) Performed: XI ROBOT ASSISTED SIGMOID COLECTOMY WITH COLORECTAL ANASTOMOSIS (N/A ) FLEXIBLE SIGMOIDOSCOPY, ICG INTRAOP ASSESSMENT OF PERFUSION (N/A )  Patient Location: PACU  Anesthesia Type:General  Level of Consciousness: awake, alert  and oriented  Airway & Oxygen Therapy: Patient Spontanous Breathing and Patient connected to face mask oxygen  Post-op Assessment: Report given to RN and Post -op Vital signs reviewed and stable  Post vital signs: Reviewed and stable  Last Vitals:  Vitals Value Taken Time  BP 120/67 05/31/20 1326  Temp    Pulse 74 05/31/20 1327  Resp 13 05/31/20 1327  SpO2 100 % 05/31/20 1327  Vitals shown include unvalidated device data.  Last Pain:  Vitals:   05/31/20 0818  TempSrc: Oral  PainSc: 0-No pain         Complications: No complications documented.

## 2020-05-31 NOTE — Anesthesia Postprocedure Evaluation (Signed)
Anesthesia Post Note  Patient: Elizabeth Dixon  Procedure(s) Performed: XI ROBOT ASSISTED SIGMOID COLECTOMY WITH COLORECTAL ANASTOMOSIS (N/A ) FLEXIBLE SIGMOIDOSCOPY, ICG INTRAOP ASSESSMENT OF PERFUSION (N/A )     Patient location during evaluation: PACU Anesthesia Type: General Level of consciousness: awake and alert and oriented Pain management: pain level controlled Vital Signs Assessment: post-procedure vital signs reviewed and stable Respiratory status: spontaneous breathing, nonlabored ventilation and respiratory function stable Cardiovascular status: blood pressure returned to baseline Postop Assessment: no apparent nausea or vomiting Anesthetic complications: no   No complications documented.  Last Vitals:  Vitals:   05/31/20 1415 05/31/20 1430  BP: 108/64 110/72  Pulse: (!) 57 (!) 59  Resp: (!) 9 12  Temp:    SpO2: 100% 100%    Last Pain:  Vitals:   05/31/20 1430  TempSrc:   PainSc: [place]

## 2020-06-01 ENCOUNTER — Encounter (HOSPITAL_COMMUNITY): Payer: Self-pay | Admitting: Surgery

## 2020-06-01 LAB — CBC
HCT: 36.1 % (ref 36.0–46.0)
Hemoglobin: 11.5 g/dL — ABNORMAL LOW (ref 12.0–15.0)
MCH: 29.3 pg (ref 26.0–34.0)
MCHC: 31.9 g/dL (ref 30.0–36.0)
MCV: 91.9 fL (ref 80.0–100.0)
Platelets: 212 10*3/uL (ref 150–400)
RBC: 3.93 MIL/uL (ref 3.87–5.11)
RDW: 13.3 % (ref 11.5–15.5)
WBC: 8.5 10*3/uL (ref 4.0–10.5)
nRBC: 0 % (ref 0.0–0.2)

## 2020-06-01 LAB — BASIC METABOLIC PANEL
Anion gap: 11 (ref 5–15)
BUN: <5 mg/dL — ABNORMAL LOW (ref 6–20)
CO2: 23 mmol/L (ref 22–32)
Calcium: 8 mg/dL — ABNORMAL LOW (ref 8.9–10.3)
Chloride: 104 mmol/L (ref 98–111)
Creatinine, Ser: 0.52 mg/dL (ref 0.44–1.00)
GFR, Estimated: >60 mL/min (ref >60)
Glucose, Bld: 99 mg/dL (ref 70–99)
Potassium: 3.3 mmol/L — ABNORMAL LOW (ref 3.5–5.1)
Sodium: 138 mmol/L (ref 135–145)

## 2020-06-01 MED ORDER — TRAMADOL HCL 50 MG PO TABS: 50.0000 mg | ORAL_TABLET | Freq: Four times a day (QID) | ORAL | 0 refills | Status: AC | PRN

## 2020-06-01 MED ORDER — POTASSIUM CHLORIDE CRYS ER 20 MEQ PO TBCR
40.0000 meq | EXTENDED_RELEASE_TABLET | Freq: Once | ORAL | Status: AC
  Administered 2020-06-01: 10:00:00 40 meq via ORAL
  Filled 2020-06-01: qty 2

## 2020-06-01 NOTE — Discharge Instructions (Signed)
POST OP INSTRUCTIONS AFTER COLON SURGERY  1. DIET: Be sure to include lots of fluids daily to stay hydrated - 64oz of water per day (8, 8 oz glasses).  Avoid fast food or heavy meals for the first couple of weeks as your are more likely to get nauseated. Avoid raw/uncooked fruits or vegetables for the first 4 weeks (its ok to have these if they are blended into smoothie form). If you have fruits/vegetables, make sure they are cooked until soft enough to mash on the roof of your mouth and chew your food well. Otherwise, diet as tolerated.  2. Take your usually prescribed home medications unless otherwise directed.  3. PAIN CONTROL: a. Pain is best controlled by a usual combination of three different methods TOGETHER: i. Ice/Heat ii. Over the counter pain medication iii. Prescription pain medication b. Most patients will experience some swelling and bruising around the surgical site.  Ice packs or heating pads (30-60 minutes up to 6 times a day) will help. Some people prefer to use ice alone, heat alone, alternating between ice & heat.  Experiment to what works for you.  Swelling and bruising can take several weeks to resolve.   c. It is helpful to take an over-the-counter pain medication regularly for the first few weeks: i. Ibuprofen (Motrin/Advil) - 200mg tabs - take 3 tabs (600mg) every 6 hours as needed for pain (unless you have been directed previously to avoid NSAIDs/ibuprofen) ii. Acetaminophen (Tylenol) - you may take 650mg every 6 hours as needed. You can take this with motrin as they act differently on the body. If you are taking a narcotic pain medication that has acetaminophen in it, do not take over the counter tylenol at the same time. iii. NOTE: You may take both of these medications together - most patients  find it most helpful when alternating between the two (i.e. Ibuprofen at 6am, tylenol at 9am, ibuprofen at 12pm ...) d. A  prescription for pain medication should be given to you  upon discharge.  Take your pain medication as prescribed if your pain is not adequatly controlled with the over-the-counter pain reliefs mentioned above.  4. Avoid getting constipated.  Between the surgery and the pain medications, it is common to experience some constipation.  Increasing fluid intake and taking a fiber supplement (such as Metamucil, Citrucel, FiberCon, MiraLax, etc) 1-2 times a day regularly will usually help prevent this problem from occurring.  A mild laxative (prune juice, Milk of Magnesia, MiraLax, etc) should be taken according to package directions if there are no bowel movements after 48 hours.    5. Dressing: Your incisions are covered in Dermabond which is like sterile superglue for the skin. This will come off on it's own in a couple weeks. It is waterproof and you may bathe normally starting the day after your surgery in a shower. Avoid baths/pools/lakes/oceans until your wounds have fully healed.  6. ACTIVITIES as tolerated:   a. Avoid heavy lifting (>10lbs or 1 gallon of milk) for the next 6 weeks. b. You may resume regular daily activities as tolerated--such as daily self-care, walking, climbing stairs--gradually increasing activities as tolerated.  If you can walk 30 minutes without difficulty, it is safe to try more intense activity such as jogging, treadmill, bicycling, low-impact aerobics.  c. DO NOT PUSH THROUGH PAIN.  Let pain be your guide: If it hurts to do something, don't do it. d. You may drive when you are no longer taking prescription pain medication, you   can comfortably wear a seatbelt, and you can safely maneuver your car and apply brakes.  7. FOLLOW UP in our office a. Please call [facility] at [phone] to set up an appointment to see your surgeon in the office for a follow-up appointment approximately 2 weeks after your surgery. b. Make sure that you call for this appointment the day you arrive home to insure a convenient appointment time.  9. If you  have disability or family leave forms that need to be completed, you may have them completed by your primary care physician's office; for return to work instructions, please ask our office staff and they will be happy to assist you in obtaining this documentation   When to call us [phone]: 1. Poor pain control 2. Reactions / problems with new medications (rash/itching, etc)  3. Fever over 101.5 F (38.5 C) 4. Inability to urinate 5. Nausea/vomiting 6. Worsening swelling or bruising 7. Continued bleeding from incision. 8. Increased pain, redness, or drainage from the incision  The clinic staff is available to answer your questions during regular business hours (8:30am-5pm).  Please don't hesitate to call and ask to speak to one of our nurses for clinical concerns.   A surgeon from [facility] is always on call at the hospitals   If you have a medical emergency, go to the nearest emergency room or call 911.  [facility] [address], [address], [place], [place]  [address] MAIN: [phone] FAX: [phone] [email or web address] 

## 2020-06-01 NOTE — Progress Notes (Signed)
Patient was given discharge instructions, and all questions were answered.  Patient was stable for discharge was taken to the main exit by wheelchair.

## 2020-06-01 NOTE — Discharge Summary (Signed)
Patient ID: Joud Pettinato MRN: [id number] DOB/AGE: 1975-05-25 45 y.o.  Admit date: 05/31/2020 Discharge date: 06/01/2020  Discharge Diagnoses Patient Active Problem List   Diagnosis Date Noted  . Cancer of sigmoid colon ([place]) 05/31/2020  . Family history of prostate cancer   . Family history of breast cancer   . Malignant neoplasm of sigmoid colon ([place]) 04/23/2020  . Morbid obesity with body mass index (BMI) of 50.0 to 59.9 in adult [facility]) 02/12/2015  . Low vitamin D level 02/09/2015  . Hyperlipidemia 09/22/2014  . Multiple sclerosis ([place]) 09/22/2014  . Ductal carcinoma in situ of left breast 08/30/2014    Procedures OR 05/31/20 robotic sigmoidectomy, flexible sigmoidoscopy  Hospital Course: She was admitted postoperatively and recovered well. On the day of surgery, tolerating liquids without any nausea or vomiting and ambulating on her own without a problem. On POD#1, her foley was removed, she was advanced to a soft diet which she tolerated well and continued to pass flatus. She had a BM. Her pain was well controlled on oral analgesics. She had no nausea or emesis. Vitals normal, afebrile. Labs with normal wbc. She was highly interested in, comfortable with and stable for discharge home. We spent time reviewing expectations, things to watch out for and ER warning signs. She expressed understanding and agreement with the plan. Her follow-up has been arranged.    Allergies as of 06/01/2020   No Known Allergies     Medication List    TAKE these medications   cholecalciferol 25 MCG (1000 UNIT) tablet Commonly known as: VITAMIN D3 Take 1,000 Units by mouth daily.   multivitamin capsule Take 1 capsule by mouth daily.   traMADol 50 MG tablet Commonly known as: Ultram Take 1 tablet (50 mg total) by mouth every 6 (six) hours as needed for up to 5 days.   zinc gluconate 50 MG tablet Take 50 mg by mouth daily.         Follow-up Information    Ileana Roup, MD Follow up in 2  week(s).   Specialty: General Surgery Contact information: [place] [address] [phone]               Rena Sweeden M. Dema Severin, M.D. [facility]

## 2020-06-01 NOTE — Progress Notes (Signed)
Subjective No acute events. She is feeling well. She has no complaints. Minimal soreness. Passing flatus. No BM yet. Denies any nausea or vomiting - tolerating liquids without any trouble. Has been up walking around. Foley out  Objective: Vital signs in last 24 hours: Temp:  [97.4 F (36.3 C)-99 F (37.2 C)] 98.3 F (36.8 C) (02/04 0455) Pulse Rate:  [55-83] 55 (02/04 0455) Resp:  [9-18] 17 (02/04 0455) BP: (95-122)/(53-72) 98/54 (02/04 0455) SpO2:  [93 %-100 %] 99 % (02/04 0455) Last BM Date: 05/31/20  Intake/Output from previous day: 02/03 0701 - 02/04 0700 In: 2080 [P.O.:480; I.V.:1500; IV Piggyback:100] Out: 1900 [Urine:1850; Blood:50] Intake/Output this shift: No intake/output data recorded.  Gen: NAD, comfortable CV: RRR Pulm: Normal work of breathing Abd: Soft, nontender throughout, nondistended; incisions c/d/i without erythema or drainage Ext: SCDs in place  Lab Results: CBC  Recent Labs    06/01/20 0450  WBC 8.5  HGB 11.5*  HCT 36.1  PLT 212   BMET Recent Labs    06/01/20 0450  NA 138  K 3.3*  CL 104  CO2 23  GLUCOSE 99  BUN <5*  CREATININE 0.52  CALCIUM 8.0*   PT/INR No results for input(s): LABPROT, INR in the last 72 hours. ABG No results for input(s): PHART, HCO3 in the last 72 hours.  Invalid input(s): PCO2, PO2  Studies/Results:  Anti-infectives: Anti-infectives (From admission, onward)   Start     Dose/Rate Route Frequency Ordered Stop   05/31/20 1400  neomycin (MYCIFRADIN) tablet 1,000 mg  Status:  Discontinued       "And" Linked Group Details   1,000 mg Oral 3 times per day 05/31/20 0734 05/31/20 0737   05/31/20 1400  metroNIDAZOLE (FLAGYL) tablet 1,000 mg  Status:  Discontinued       "And" Linked Group Details   1,000 mg Oral 3 times per day 05/31/20 0734 05/31/20 0737   05/31/20 0745  cefoTEtan (CEFOTAN) 2 g in sodium chloride 0.9 % 100 mL IVPB        2 g 200 mL/hr over 30 Minutes Intravenous On call to O.R. 05/31/20 0734  05/31/20 1115       Assessment/Plan: Patient Active Problem List   Diagnosis Date Noted  . Cancer of sigmoid colon ([place]) 05/31/2020  . Family history of prostate cancer   . Family history of breast cancer   . Malignant neoplasm of sigmoid colon ([place]) 04/23/2020  . Morbid obesity with body mass index (BMI) of 50.0 to 59.9 in adult [facility]) 02/12/2015  . Low vitamin D level 02/09/2015  . Hyperlipidemia 09/22/2014  . Multiple sclerosis ([place]) 09/22/2014  . Ductal carcinoma in situ of left breast 08/30/2014   s/p Procedure(s): XI ROBOT ASSISTED SIGMOID COLECTOMY WITH COLORECTAL ANASTOMOSIS FLEXIBLE SIGMOIDOSCOPY, ICG INTRAOP ASSESSMENT OF PERFUSION 05/31/2020 Hx colon cancer, sigmoid  -Doing great! -Advance to soft diet as tolerated -Ambulate 5x/day as able -We spent time reviewing her procedure, findings, and plans; answering her questions; going over expectations -PPx: SQH, SCDs   LOS: 1 day   Nadeen Landau, MD [facility] Use [email or web address] to contact on call provider

## 2020-06-05 NOTE — H&P (Signed)
Elizabeth Dixon  has presented today for surgery, with the diagnosis of COLON CANCER SIGMOID.  The various methods of treatment have been discussed with the patient and family. After consideration of risks, benefits and other options for treatment, the patient has consented to  Procedure(s): XI ROBOT ASSISTED SIGMOID COLECTOMY WITH COLORECTAL ANASTOMOSIS (N/A) FLEXIBLE SIGMOIDOSCOPY, ICG INTRAOP ASSESSMENT OF PERFUSION (N/A) as a surgical intervention.  The patient's history has been reviewed, she has been examined and reports no change in status, she states she is ready for surgery.  We spent time reviewing the planned procedure, material risks, benefits and alternatives. Her questions were answered to her satisfaction. We reviewed typical postoperative expectations. She is has elected to proceed with surgery  Nadeen Landau, MD [facility]  ADDENDUM INTERVAL H&P Note source should have been my H&P dated in Epic as 05/25/2020 which was copied from our office H&P 05/18/2020.

## 2020-06-07 LAB — SURGICAL PATHOLOGY

## 2020-06-13 ENCOUNTER — Other Ambulatory Visit: Payer: Self-pay

## 2020-06-13 NOTE — Progress Notes (Signed)
The proposed treatment discussed in conference is for discussion purposes only and is not a binding recommendation.  The patients have not been physically examined, or presented with their treatment options.  Therefore, final treatment plans cannot be decided.   

## 2020-08-13 ENCOUNTER — Telehealth: Payer: Self-pay | Admitting: Family

## 2020-08-13 DIAGNOSIS — K769 Liver disease, unspecified: Secondary | ICD-10-CM

## 2020-08-13 NOTE — Telephone Encounter (Signed)
Please advise patient that she is due for a follow up MRI to check a spot that was seen on her liver. I have placed the order and she should be contacted about scheduling.

## 2020-08-13 NOTE — Telephone Encounter (Signed)
lvm for patient to be aware of test ordered.

## 2020-09-08 ENCOUNTER — Other Ambulatory Visit: Payer: Self-pay

## 2020-09-08 ENCOUNTER — Ambulatory Visit (HOSPITAL_BASED_OUTPATIENT_CLINIC_OR_DEPARTMENT_OTHER)
Admission: RE | Admit: 2020-09-08 | Discharge: 2020-09-08 | Disposition: A | Discharge status: Home / Self Care | Payer: BC Managed Care – PPO | Source: Ambulatory Visit | Attending: Family | Admitting: Family

## 2020-09-08 DIAGNOSIS — Z85038 Personal history of other malignant neoplasm of large intestine: Secondary | ICD-10-CM | POA: Diagnosis not present

## 2020-09-08 DIAGNOSIS — K769 Liver disease, unspecified: Secondary | ICD-10-CM | POA: Diagnosis not present

## 2020-09-08 DIAGNOSIS — K8020 Calculus of gallbladder without cholecystitis without obstruction: Secondary | ICD-10-CM | POA: Diagnosis not present

## 2020-09-08 DIAGNOSIS — K7689 Other specified diseases of liver: Secondary | ICD-10-CM | POA: Diagnosis not present

## 2020-09-08 IMAGING — MR MR ABDOMEN WO/W CM
13 of 21 series · 26 of 48 positions shown · IV contrast (GADAVIST)
Comparison: MR abdomen, [DATE]

CLINICAL DATA: Follow-up liver lesion, history of colon cancer

EXAM:
MRI ABDOMEN WITHOUT AND WITH CONTRAST
TECHNIQUE: Multiplanar multisequence MR imaging of the abdomen was performed
both before and after the administration of intravenous contrast.
CONTRAST:  10mL GADAVIST GADOBUTROL 1 MMOL/ML IV SOLN

[Series 3: cor haste · coronal · 6.0mm · 0.78mm/px · 1 of 34 slices shown]
[im 1/34]
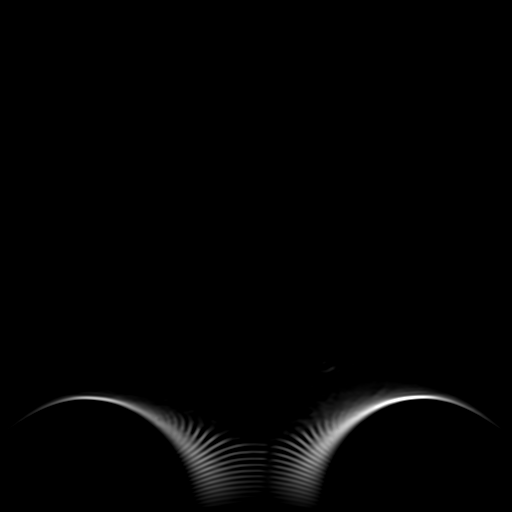

[Series 4: T2 · axial · 6.0mm · 1.56mm/px · 1 of 38 slices shown]
[im 1/38]
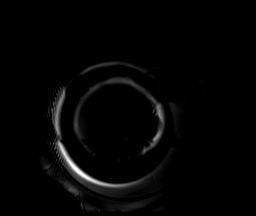

[Series 5: T1 · axial · 6.0mm · 0.78mm/px · 1 of 38 slices shown (1 of 2)]
[im 1/38]
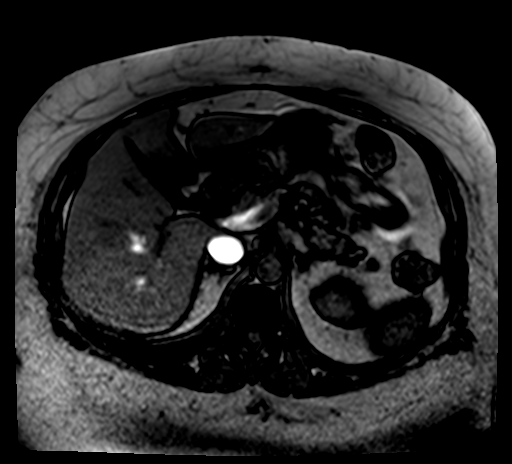

[Series 6: T1 · axial · 6.0mm · 0.78mm/px · z∈[-143,+117]mm · 2 of 76 slices shown (2 of 2)]
[im 1/76]
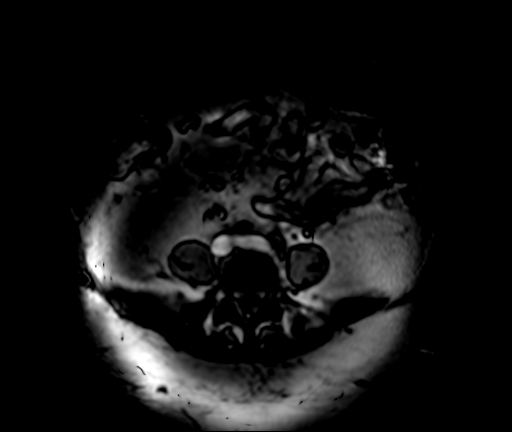
[im 76/76]
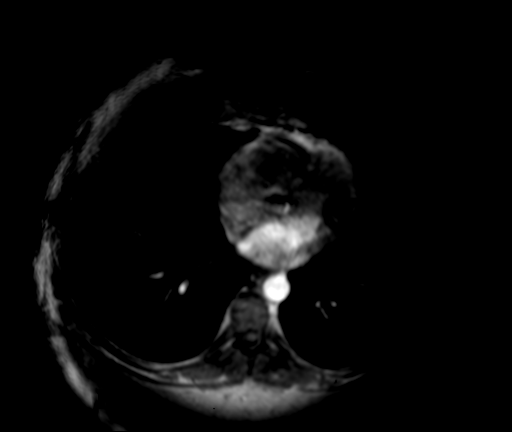

[Series 7: ep2d_[id number]_500_800_p2_trig · axial · 7.0mm · 2.08mm/px · z∈[-104,+178]mm · 3 of 96 slices shown]
[im 1/96]
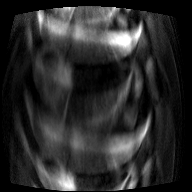
[im 48/96]
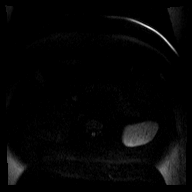
[im 96/96]
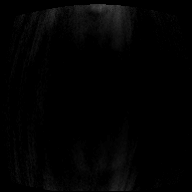

[Series 8: ep2d_[id number]_500_800_p2_trig_adc · axial · 7.0mm · 2.08mm/px · 1 of 32 slices shown]
[im 1/32]
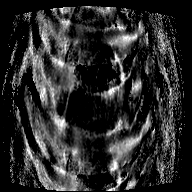

[Series 9: bSSFP · axial · 6.0mm · 0.78mm/px · 1 of 38 slices shown]
[im 1/38]
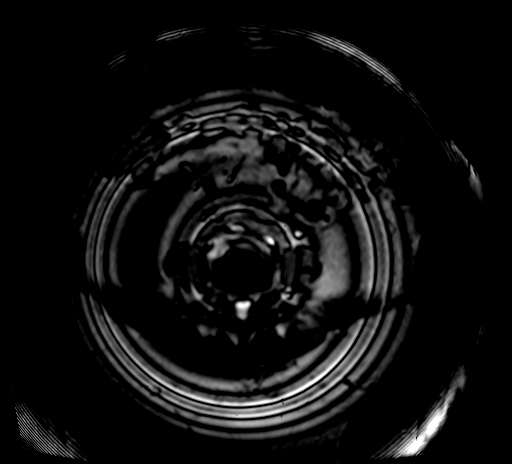

[Series 13: T1 dynamic · axial · non-contrast · 5.0mm · 0.78mm/px · z∈[-148,+127]mm · 3 of 56 slices shown]
[im 1/56]
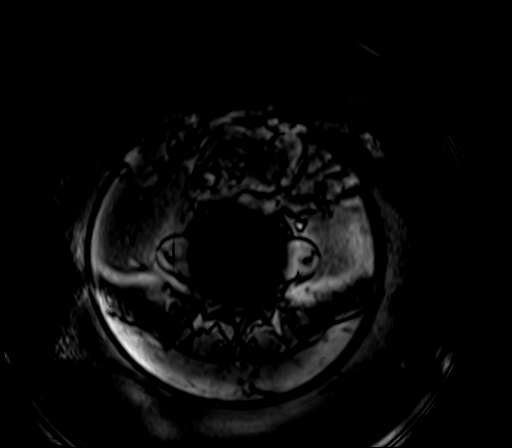
[im 28/56]
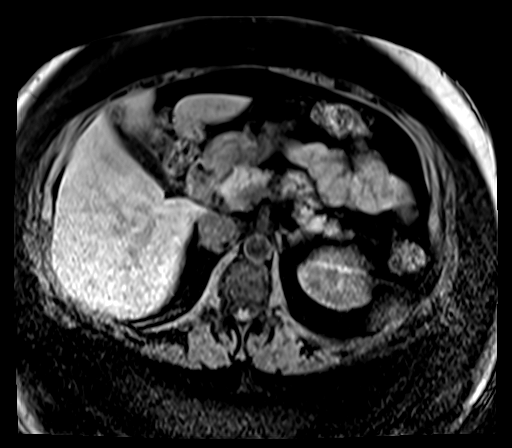
[im 56/56]
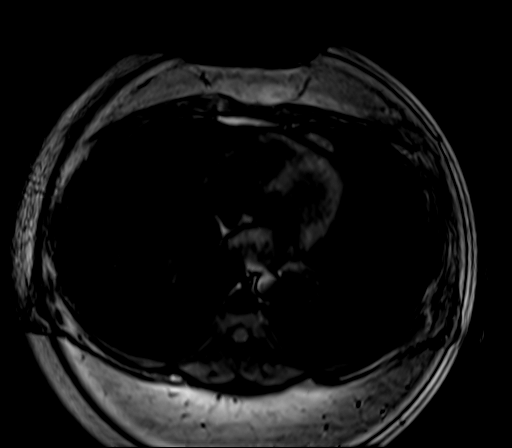

[Series 15: T1 dynamic post-contrast · axial · 5.0mm · 0.78mm/px · z∈[-148,+127]mm · 3 of 56 slices shown (1 of 5)]
[im 1/56]
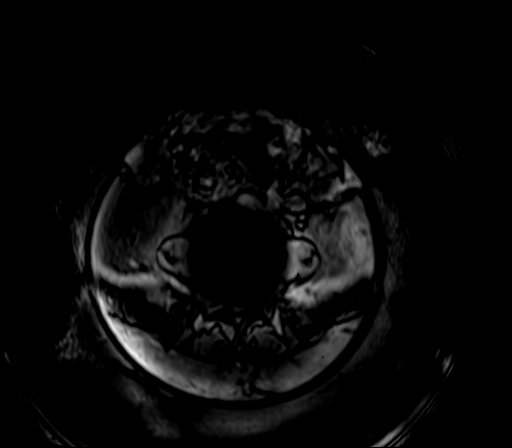
[im 28/56]
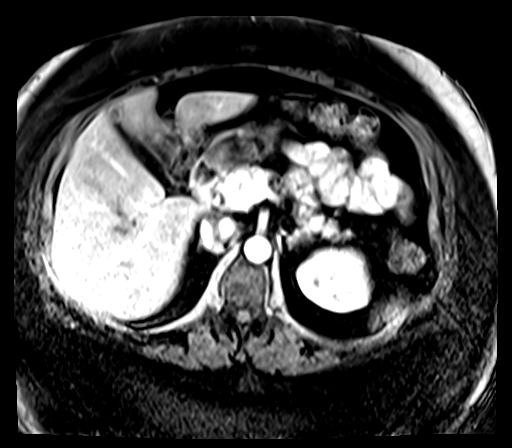
[im 56/56]
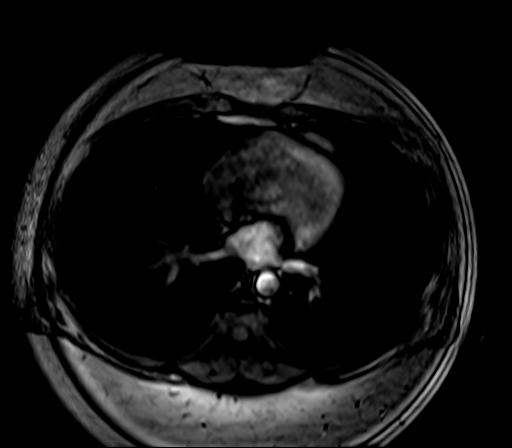

[Series 16: T1 dynamic post-contrast · axial · 5.0mm · 0.78mm/px · z∈[-148,+127]mm · 3 of 56 slices shown (2 of 5)]
[im 1/56]
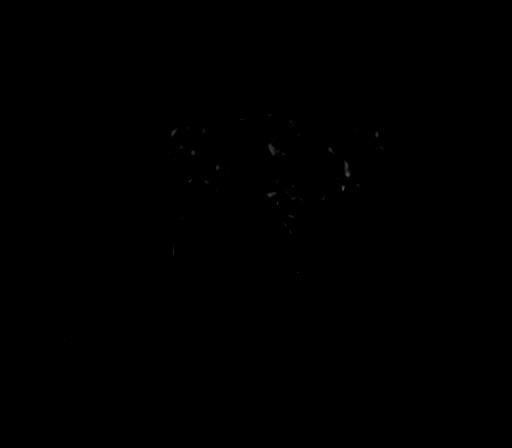
[im 28/56]
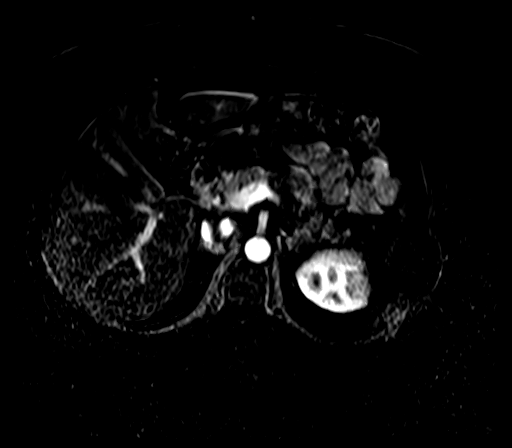
[im 56/56]
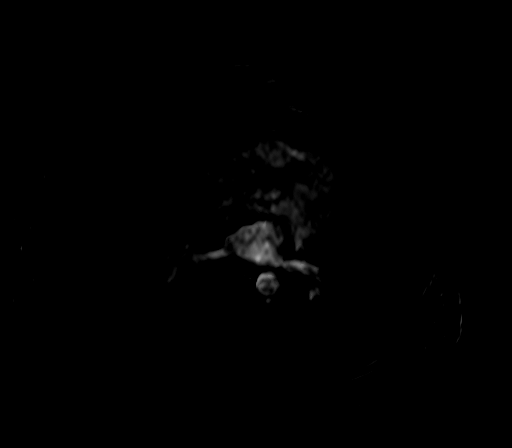

[Series 17: T1 dynamic post-contrast · axial · 5.0mm · 0.78mm/px · z∈[-148,+127]mm · 3 of 56 slices shown (3 of 5)]
[im 1/56]
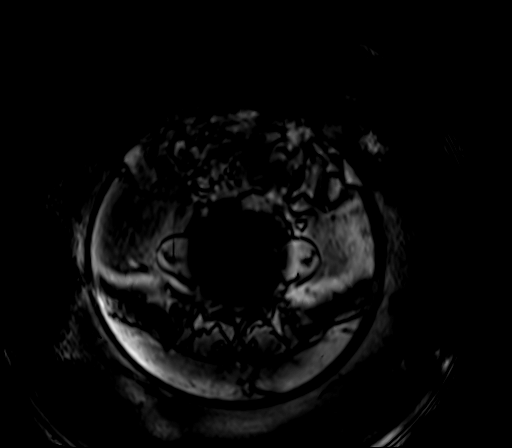
[im 28/56]
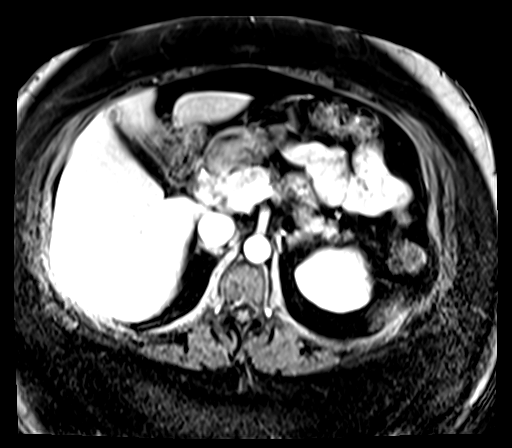
[im 56/56]
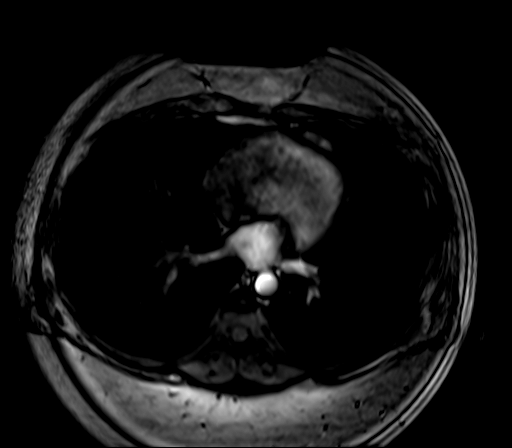

[Series 18: T1 dynamic post-contrast · axial · 5.0mm · 0.78mm/px · z∈[-148,+127]mm · 3 of 56 slices shown (4 of 5)]
[im 1/56]
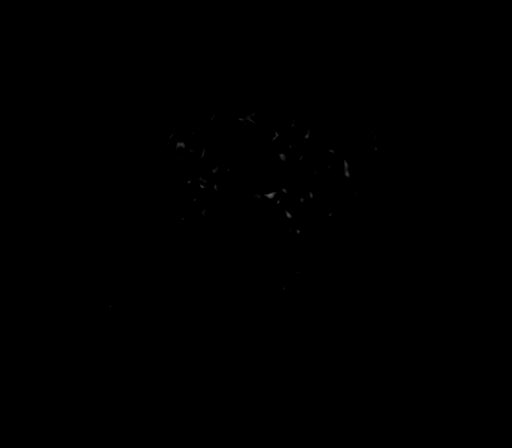
[im 28/56]
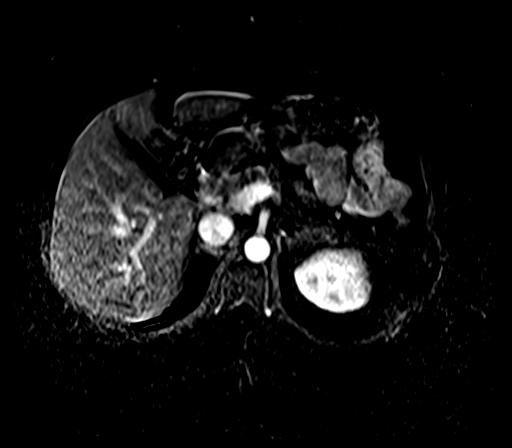
[im 56/56]
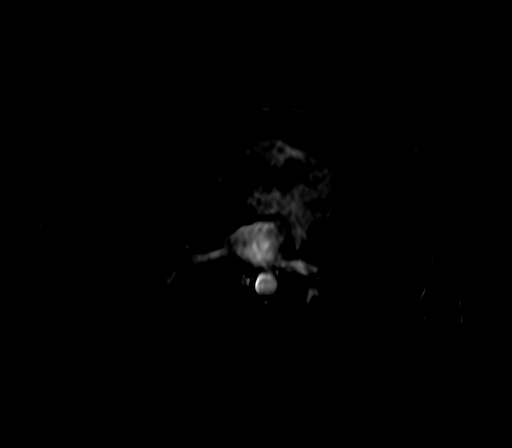

[Series 19: T1 dynamic post-contrast · axial · 5.0mm · 0.78mm/px · 1 of 56 slices shown (5 of 5)]
[im 1/56]
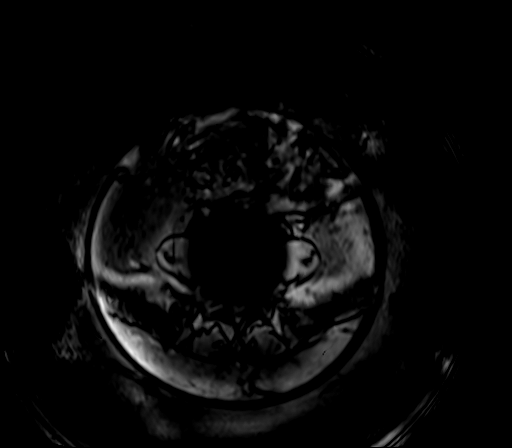

[26 of 48 positions shown; findings below may reference images not displayed]

FINDINGS: Lower chest: No acute findings.

Hepatobiliary: Redemonstrated subcentimeter fluid signal lesion of
the central hepatic dome, measuring no greater than 6 mm (series 4,
image 9). There is no appreciable contrast enhancement associated
with this lesion. No other parenchymal abnormality identified.
Gallstones in the gallbladder. No gallbladder wall thickening or
biliary ductal dilatation.

Pancreas: No mass, inflammatory changes, or other parenchymal
abnormality identified.

Spleen:  Within normal limits in size and appearance.

Adrenals/Urinary Tract: No masses identified. No evidence of
hydronephrosis.

Stomach/Bowel: Visualized portions within the abdomen are
unremarkable.

Vascular/Lymphatic: No pathologically enlarged lymph nodes
identified. No abdominal aortic aneurysm demonstrated.

Other:  None.

Musculoskeletal: No suspicious bone lesions identified.
IMPRESSION: 1. Redemonstrated subcentimeter fluid signal lesion of the central
hepatic dome, measuring no greater than 6 mm. There is no
appreciable contrast enhancement associated with this lesion. This
lesion remains incompletely characterized due to small size but in
general, is almost certainly a tiny cyst or hemangioma. Solitary
metastatic lesion not favored.
2. Cholelithiasis. No evidence of acute cholecystitis or biliary
ductal dilatation.

## 2020-09-08 MED ORDER — GADOBUTROL 1 MMOL/ML IV SOLN
10.0000 mL | Freq: Once | INTRAVENOUS | Status: AC | PRN
  Administered 2020-09-08: 10 mL via INTRAVENOUS

## 2021-01-03 DIAGNOSIS — Z853 Personal history of malignant neoplasm of breast: Secondary | ICD-10-CM | POA: Diagnosis not present

## 2021-01-03 DIAGNOSIS — Z86000 Personal history of in-situ neoplasm of breast: Secondary | ICD-10-CM | POA: Diagnosis not present

## 2021-01-03 DIAGNOSIS — Z9049 Acquired absence of other specified parts of digestive tract: Secondary | ICD-10-CM | POA: Diagnosis not present

## 2021-01-03 DIAGNOSIS — Z9889 Other specified postprocedural states: Secondary | ICD-10-CM | POA: Diagnosis not present

## 2021-01-03 DIAGNOSIS — Z923 Personal history of irradiation: Secondary | ICD-10-CM | POA: Diagnosis not present

## 2021-01-03 DIAGNOSIS — Z08 Encounter for follow-up examination after completed treatment for malignant neoplasm: Secondary | ICD-10-CM | POA: Diagnosis not present

## 2021-01-03 DIAGNOSIS — Z85038 Personal history of other malignant neoplasm of large intestine: Secondary | ICD-10-CM | POA: Diagnosis not present

## 2021-01-03 DIAGNOSIS — R928 Other abnormal and inconclusive findings on diagnostic imaging of breast: Secondary | ICD-10-CM | POA: Diagnosis not present

## 2021-01-03 LAB — HM MAMMOGRAPHY

## 2021-02-11 ENCOUNTER — Other Ambulatory Visit: Payer: Self-pay

## 2021-02-11 ENCOUNTER — Encounter: Payer: Self-pay | Admitting: Family

## 2021-02-11 ENCOUNTER — Ambulatory Visit (INDEPENDENT_AMBULATORY_CARE_PROVIDER_SITE_OTHER): Payer: BC Managed Care – PPO | Admitting: Family

## 2021-02-11 VITALS — BP 106/66 | HR 69 | Temp 98.0°F | Resp 16 | Ht 63.5 in | Wt 307.0 lb

## 2021-02-11 DIAGNOSIS — D0512 Intraductal carcinoma in situ of left breast: Secondary | ICD-10-CM

## 2021-02-11 DIAGNOSIS — E785 Hyperlipidemia, unspecified: Secondary | ICD-10-CM

## 2021-02-11 DIAGNOSIS — Z0000 Encounter for general adult medical examination without abnormal findings: Secondary | ICD-10-CM | POA: Diagnosis not present

## 2021-02-11 DIAGNOSIS — E876 Hypokalemia: Secondary | ICD-10-CM

## 2021-02-11 DIAGNOSIS — C187 Malignant neoplasm of sigmoid colon: Secondary | ICD-10-CM

## 2021-02-11 LAB — COMPREHENSIVE METABOLIC PANEL
ALT: 11 U/L (ref 0–35)
AST: 13 U/L (ref 0–37)
Albumin: 3.6 g/dL (ref 3.5–5.2)
Alkaline Phosphatase: 86 U/L (ref 39–117)
BUN: 14 mg/dL (ref 6–23)
CO2: 29 mEq/L (ref 19–32)
Calcium: 8.9 mg/dL (ref 8.4–10.5)
Chloride: 101 mEq/L (ref 96–112)
Creatinine, Ser: 0.62 mg/dL (ref 0.40–1.20)
GFR: 106.49 mL/min (ref >60.00)
Glucose, Bld: 93 mg/dL (ref 70–99)
Potassium: 4.4 mEq/L (ref 3.5–5.1)
Sodium: 137 mEq/L (ref 135–145)
Total Bilirubin: 0.5 mg/dL (ref 0.2–1.2)
Total Protein: 6.6 g/dL (ref 6.0–8.3)

## 2021-02-11 LAB — LIPID PANEL
Cholesterol: 227 mg/dL — ABNORMAL HIGH (ref 0–200)
HDL: 61.6 mg/dL (ref >39.00)
LDL Cholesterol: 144 mg/dL — ABNORMAL HIGH (ref 0–99)
NonHDL: 165.49
Total CHOL/HDL Ratio: 4
Triglycerides: 108 mg/dL (ref 0.0–149.0)
VLDL: 21.6 mg/dL (ref 0.0–40.0)

## 2021-02-11 NOTE — Assessment & Plan Note (Signed)
Had a follow up mammogram this year, normal. Continues to follow with oncology.

## 2021-02-11 NOTE — Assessment & Plan Note (Signed)
S/p XI ROBOT ASSISTED SIGMOID COLECTOMY WITH COLORECTAL ANASTOMOSIS.  She notes some constipation. Discussed adding miralax once daily starting with one tablespoon/day and titrating upward from there.

## 2021-02-11 NOTE — Progress Notes (Signed)
Subjective:   By signing my name below, I, [name], attest that this documentation has been prepared under the direction and in the presence of [name] NP. 02/11/2021     Patient ID: Elizabeth Dixon, female    DOB: Jul 26, 1975, 45 y.o.   MRN: [id number]  No chief complaint on file.   HPI Patient is in today for a comprehensive physical exam.   She denies having any unexpected weight change, ear pain, hearing loss and rhinorrhea, visual disturbance, cough, chest pain and leg swelling, nausea, vomiting, diarrhea and blood in stool, or dysuria and frequency, for myalgias and arthralgias, rash, headaches, adenopathy, depression or anxiety at this time. Social history- She has no recent changes to her family medical history. She drinks occasional alcohol. She does not use drugs. She does not use tobacco or vaping products.  Colon Cancer - She received a colon procedure to manage her colon cancer since her last visit. She also reports she continues seeing an oncologist specialist to manage her colon cancer and previous breast cancer.  Allergies- She reports having mild congestion from allergies. She is taking Claritin D to manage her symptoms and reports having dry mouth while taking it.  Constipation- She reports having constipation at this time. She is not taking any medication to manage her symptoms.  Sleep- She complains of difficult falling asleep at night. She is taking melatonin supplements at night and finds mild relief. She notes that she only drinks coffee in the morning. She uses screens before sleeping at night.   Immunizations: She is UTD on tetanus and flu vaccines. She has 1 johnson and johnson vaccine and one Estate manager/land agent booster vaccine. She was informed of the new bivalent Covid-19 booster vaccine during this visit.  Diet: She is managing a healthy diet. She reports doing a keto diet for a year but stopped a couple of months ago.  Exercise: She participates in regular  exercise at her workplace by walking.  Colonoscopy: Last completed 04/23/2020. Results showed one 15 mm polyp in the proximal sigmoid colon which was removed, one 30 mm polyp in the distal sigmoid colon which was removed, diverticulosis in sigmoid colon, otherwise results are normal.  Pap Smear: Last completed 02/01/2019. Results are normal. Repeat in 3 years.  Mammogram: Last completed 01/03/2021. Results are normal.  Dental: she is UTD on dental care.  Vision: She is due for vision care and is planning on setting up an appointment.    Health Maintenance Due  Topic Date Due   URINE MICROALBUMIN  Never done   COVID-19 Vaccine (2 - Janssen risk series) 07/29/2019    Past Medical History:  Diagnosis Date   Anemia    Breast cancer ([place]) 2016   left breast- ductal carcinoma in situ   Family history of breast cancer    Family history of prostate cancer    Hyperlipidemia    hx of   Iron deficiency anemia    hx of   Multiple sclerosis ([place])    dx 2002   Neuromuscular disorder [facility])     Past Surgical History:  Procedure Laterality Date   BREAST LUMPECTOMY Left 08/2014   BREAST REDUCTION SURGERY Bilateral 08/2014   CESAREAN SECTION  2013   FLEXIBLE SIGMOIDOSCOPY N/A 05/31/2020   Procedure: FLEXIBLE SIGMOIDOSCOPY, ICG INTRAOP ASSESSMENT OF PERFUSION;  Surgeon: Ileana Roup, MD;  Location: [facility];  Service: General;  Laterality: N/A;   SIGMOIDECTOMY  05/31/2020   XI ROBOT ASSISTED SIGMOID COLECTOMY WITH COLORECTAL  ANASTOMOSIS   VAGINA SURGERY     vagina tear from childbirth   WISDOM TOOTH EXTRACTION      Family History  Problem Relation Age of Onset   Thyroid disease Mother    Hypertension Father    Prostate cancer Father 54   Skin cancer Father 66   Parkinson's disease Paternal Grandmother    Breast cancer Other        unconfirmed diagnosis, great-aunt (MGF's sister)   Colon cancer Neg Hx    Colon polyps Neg Hx    Esophageal cancer Neg Hx    Stomach cancer Neg Hx     Rectal cancer Neg Hx     Social History   Socioeconomic History   Marital status: Married    Spouse name: Not on file   Number of children: Not on file   Years of education: Not on file   Highest education level: Not on file  Occupational History   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  Tobacco Use   Smoking status: Former    Types: Cigarettes    Quit date: 2006    Years since quitting: 16.8   Smokeless tobacco: Never  Vaping Use   Vaping Use: Never used  Substance and Sexual Activity   Alcohol use: Yes    Alcohol/week: 1.0 standard drink    Types: 1 Standard drinks or equivalent per week    Comment: once a wk   Drug use: Never   Sexual activity: Yes    Partners: Male  Other Topics Concern   Not on file  Social History Narrative   Works as an Geologist, engineering   Married-  Children 2013 son and 2011 daughter   Moved from [place]   Enjoys children's sports   Enjoys outside activities   dog   Complete bachelors degree      Right handed   Social Determinants of Health   Financial Resource Strain: Not on file  Food Insecurity: Not on file  Transportation Needs: Not on file  Physical Activity: Not on file  Stress: Not on file  Social Connections: Not on file  Intimate Partner Violence: Not on file    Outpatient Medications Prior to Visit  Medication Sig Dispense Refill   cholecalciferol (VITAMIN D3) 25 MCG (1000 UNIT) tablet Take 1,000 Units by mouth daily.     Multiple Vitamin (MULTIVITAMIN) capsule Take 1 capsule by mouth daily.     zinc gluconate 50 MG tablet Take 50 mg by mouth daily.     No facility-administered medications prior to visit.    No Known Allergies  Review of Systems  Constitutional:        (-)unexpected weight change (-)Adenopathy  HENT:  Negative for hearing loss.        (-)Rhinorrhea   Eyes:        (-)Visual disturbance  Respiratory:  Negative for cough.   Cardiovascular:  Negative for chest pain and leg swelling.  Gastrointestinal:  Positive for  constipation. Negative for blood in stool, diarrhea, nausea and vomiting.  Genitourinary:  Negative for dysuria and frequency.  Musculoskeletal:  Negative for joint pain and myalgias.  Skin:  Negative for rash.  Neurological:  Negative for headaches.  Psychiatric/Behavioral:  Negative for depression. The patient is not nervous/anxious.       Objective:    Physical Exam Constitutional:      General: She is not in acute distress.    Appearance: Normal appearance. She is not ill-appearing.  HENT:  Head: Normocephalic and atraumatic.     Right Ear: Tympanic membrane, ear canal and external ear normal.     Left Ear: Tympanic membrane, ear canal and external ear normal.  Eyes:     Extraocular Movements: Extraocular movements intact.     Pupils: Pupils are equal, round, and reactive to light.  Cardiovascular:     Rate and Rhythm: Normal rate and regular rhythm.     Heart sounds: Normal heart sounds. No murmur heard.   No gallop.  Pulmonary:     Effort: Pulmonary effort is normal. No respiratory distress.     Breath sounds: Normal breath sounds. No wheezing or rales.  Abdominal:     General: There is no distension.     Palpations: Abdomen is soft.     Tenderness: There is no abdominal tenderness. There is no guarding.  Skin:    General: Skin is warm and dry.  Neurological:     Mental Status: She is alert and oriented to person, place, and time.     Deep Tendon Reflexes:     Reflex Scores:      Patellar reflexes are 1+ on the right side and 1+ on the left side. Psychiatric:        Behavior: Behavior normal.        Judgment: Judgment normal.    BP 106/66 (BP Location: Right Arm, Patient Position: Sitting, Cuff Size: Large)   Pulse 69   Temp 98 F (36.7 C) (Oral)   Resp 16   Ht 5' 3.5" (1.613 m)   Wt (!) 307 lb (139.3 kg)   SpO2 100%   BMI 53.53 kg/m  Wt Readings from Last 3 Encounters:  02/11/21 (!) 307 lb (139.3 kg)  05/31/20 275 lb (124.7 kg)  05/28/20 275 lb  (124.7 kg)       Assessment & Plan:   Problem List Items Addressed This Visit       Unprioritized   Preventative health care - Primary    Discussed healthy diet, exercise and weight loss. Colo will be due early January 2023- she will reach out to GI in December to schedule. Mammo and pap up to date. Recommended that she consider the new bivalent covid booster.       Hyperlipidemia   Relevant Orders   Lipid panel   Ductal carcinoma in situ of left breast    Had a follow up mammogram this year, normal. Continues to follow with oncology.      Cancer of sigmoid colon ([place])    S/p XI ROBOT ASSISTED SIGMOID COLECTOMY WITH COLORECTAL ANASTOMOSIS.  She notes some constipation. Discussed adding miralax once daily starting with one tablespoon/day and titrating upward from there.       Other Visit Diagnoses     Hypokalemia       Relevant Orders   Comp Met (CMET)        No orders of the defined types were placed in this encounter.   I, [name] NP, personally preformed the services described in this documentation.  All medical record entries made by the scribe were at my direction and in my presence.  I have reviewed the chart and discharge instructions (if applicable) and agree that the record reflects my personal performance and is accurate and complete. 02/11/2021   I,[name],acting as a Education administrator for Nance Pear, NP.,have documented all relevant documentation on the behalf of Nance Pear, NP,as directed by  Nance Pear, NP while in the  presence of Nance Pear, NP.    Nance Pear, NP

## 2021-02-11 NOTE — Assessment & Plan Note (Signed)
Discussed healthy diet, exercise and weight loss. Colo will be due early January 2023- she will reach out to GI in December to schedule. Mammo and pap up to date. Recommended that she consider the new bivalent covid booster.

## 2021-02-11 NOTE — Patient Instructions (Signed)
Please complete lab work prior to leaving. Continue your work on healthy diet, exercise and weight loss effort.

## 2021-04-25 ENCOUNTER — Encounter: Payer: Self-pay | Admitting: Internal Medicine

## 2021-05-16 ENCOUNTER — Encounter: Payer: Self-pay | Admitting: Internal Medicine

## 2021-05-24 DIAGNOSIS — Z20822 Contact with and (suspected) exposure to covid-19: Secondary | ICD-10-CM | POA: Diagnosis not present

## 2021-05-24 DIAGNOSIS — J029 Acute pharyngitis, unspecified: Secondary | ICD-10-CM | POA: Diagnosis not present

## 2021-05-27 ENCOUNTER — Encounter: Payer: Self-pay | Admitting: Family Medicine

## 2021-05-27 ENCOUNTER — Ambulatory Visit (INDEPENDENT_AMBULATORY_CARE_PROVIDER_SITE_OTHER): Payer: BC Managed Care – PPO | Admitting: Family Medicine

## 2021-05-27 VITALS — BP 141/72 | HR 83 | Temp 98.0°F | Ht 63.5 in | Wt 310.6 lb

## 2021-05-27 DIAGNOSIS — J069 Acute upper respiratory infection, unspecified: Secondary | ICD-10-CM | POA: Diagnosis not present

## 2021-05-27 MED ORDER — FLUTICASONE PROPIONATE 50 MCG/ACT NA SUSP: 2.0000 | Freq: Every day | NASAL | 0 refills | Status: AC

## 2021-05-27 MED ORDER — GUAIFENESIN ER 600 MG PO TB12: 1200.0000 mg | ORAL_TABLET | Freq: Two times a day (BID) | ORAL | 2 refills | Status: DC

## 2021-05-27 MED ORDER — PREDNISONE 20 MG PO TABS: 40.0000 mg | ORAL_TABLET | Freq: Every day | ORAL | 0 refills | Status: AC

## 2021-05-27 MED ORDER — ALBUTEROL SULFATE HFA 108 (90 BASE) MCG/ACT IN AERS: 2.0000 | INHALATION_SPRAY | Freq: Four times a day (QID) | RESPIRATORY_TRACT | 11 refills | Status: DC | PRN

## 2021-05-27 NOTE — Progress Notes (Signed)
Acute Office Visit  Subjective:    Patient ID: Elizabeth Dixon, female    DOB: 14-Jul-1975, 46 y.o.   MRN: [id number]  Chief Complaint  Patient presents with   Cough    Mucinex cough syrup, sudafed, afrin nasal spray    Cough  Patient is in today for URI,   Patient states that on Thursday she started with a sore throat and congestion. Her two children were also sick. She went to urgent care over the weekend and tested negative for COVID and strep and was told her lungs sounded good. She reports productive cough with yellow sputum, mild dyspnea (slightly better today than yesterday), wheezing, chills, sinus pressure, headaches. This morning she woke up with her eyes puffy/swollen-shut and watery, but denies any pain, purulent discharge/crusting. She denies any fevers, nausea, vomiting, diarrhea, chest pain, ear pain. She has been taking Sudafed, Mucinex cough syrup and using occasional Afrin at night.       Past Medical History:  Diagnosis Date   Anemia    Breast cancer ([place]) 2016   left breast- ductal carcinoma in situ   Family history of breast cancer    Family history of prostate cancer    Hyperlipidemia    hx of   Iron deficiency anemia    hx of   Multiple sclerosis ([place])    dx 2002   Neuromuscular disorder [facility])     Past Surgical History:  Procedure Laterality Date   BREAST LUMPECTOMY Left 08/2014   BREAST REDUCTION SURGERY Bilateral 08/2014   CESAREAN SECTION  2013   FLEXIBLE SIGMOIDOSCOPY N/A 05/31/2020   Procedure: FLEXIBLE SIGMOIDOSCOPY, ICG INTRAOP ASSESSMENT OF PERFUSION;  Surgeon: Ileana Roup, MD;  Location: [facility];  Service: General;  Laterality: N/A;   SIGMOIDECTOMY  05/31/2020   XI ROBOT ASSISTED SIGMOID COLECTOMY WITH COLORECTAL ANASTOMOSIS   VAGINA SURGERY     vagina tear from childbirth   WISDOM TOOTH EXTRACTION      Family History  Problem Relation Age of Onset   Thyroid disease Mother    Hypertension Father    Prostate cancer Father 4    Skin cancer Father 16   Parkinson's disease Paternal Grandmother    Breast cancer Other        unconfirmed diagnosis, great-aunt (MGF's sister)   Colon cancer Neg Hx    Colon polyps Neg Hx    Esophageal cancer Neg Hx    Stomach cancer Neg Hx    Rectal cancer Neg Hx     Social History   Socioeconomic History   Marital status: Married    Spouse name: Not on file   Number of children: Not on file   Years of education: Not on file   Highest education level: Not on file  Occupational History   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  Tobacco Use   Smoking status: Former    Types: Cigarettes    Quit date: 2006    Years since quitting: 17.0   Smokeless tobacco: Never  Vaping Use   Vaping Use: Never used  Substance and Sexual Activity   Alcohol use: Yes    Alcohol/week: 1.0 standard drink    Types: 1 Standard drinks or equivalent per week    Comment: once a wk   Drug use: Never   Sexual activity: Yes    Partners: Male  Other Topics Concern   Not on file  Social History Narrative   Works as an Geologist, engineering   Married-  Children 2013  son and 2011 daughter   Moved from [facility]   Enjoys children's sports   Enjoys outside activities   dog   Complete bachelors degree      Right handed   Social Determinants of Health   Financial Resource Strain: Not on file  Food Insecurity: Not on file  Transportation Needs: Not on file  Physical Activity: Not on file  Stress: Not on file  Social Connections: Not on file  Intimate Partner Violence: Not on file    Outpatient Medications Prior to Visit  Medication Sig Dispense Refill   cholecalciferol (VITAMIN D3) 25 MCG (1000 UNIT) tablet Take 1,000 Units by mouth daily.     Multiple Vitamin (MULTIVITAMIN) capsule Take 1 capsule by mouth daily.     zinc gluconate 50 MG tablet Take 50 mg by mouth daily.     No facility-administered medications prior to visit.    No Known Allergies  Review of Systems All review of systems negative except what is  listed in the HPI      Objective:    Physical Exam Constitutional:      Appearance: Normal appearance. She is obese.  HENT:     Head: Normocephalic and atraumatic.     Right Ear: Tympanic membrane normal.     Left Ear: Tympanic membrane normal.  Eyes:     General:        Right eye: No discharge.        Left eye: No discharge.     Extraocular Movements: Extraocular movements intact.     Conjunctiva/sclera: Conjunctivae normal.  Cardiovascular:     Rate and Rhythm: Normal rate and regular rhythm.  Pulmonary:     Effort: Pulmonary effort is normal.     Breath sounds: Wheezing present. No rhonchi or rales.  Musculoskeletal:     Cervical back: Normal range of motion and neck supple.  Skin:    General: Skin is warm and dry.  Neurological:     General: No focal deficit present.     Mental Status: She is alert and oriented to person, place, and time. Mental status is at baseline.  Psychiatric:        Mood and Affect: Mood normal.        Behavior: Behavior normal.        Thought Content: Thought content normal.        Judgment: Judgment normal.    BP (!) 141/72    Pulse 83    Temp 98 F (36.7 C)    Ht 5' 3.5" (1.613 m)    Wt (!) 310 lb 9.6 oz (140.9 kg)    SpO2 100%    BMI 54.16 kg/m  Wt Readings from Last 3 Encounters:  05/27/21 (!) 310 lb 9.6 oz (140.9 kg)  02/11/21 (!) 307 lb (139.3 kg)  05/31/20 275 lb (124.7 kg)    Health Maintenance Due  Topic Date Due   URINE MICROALBUMIN  Never done   COVID-19 Vaccine (2 - [place] risk series) 07/29/2019   COLONOSCOPY (Pts 45-25yrs Insurance coverage will need to be confirmed)  04/23/2021    There are no preventive care reminders to display for this patient.   Lab Results  Component Value Date   TSH 1.77 02/01/2019   Lab Results  Component Value Date   WBC 8.5 06/01/2020   HGB 11.5 (L) 06/01/2020   HCT 36.1 06/01/2020   MCV 91.9 06/01/2020   PLT 212 06/01/2020   Lab Results  Component Value Date  NA 137  02/11/2021   K 4.4 02/11/2021   CO2 29 02/11/2021   GLUCOSE 93 02/11/2021   BUN 14 02/11/2021   CREATININE 0.62 02/11/2021   BILITOT 0.5 02/11/2021   ALKPHOS 86 02/11/2021   AST 13 02/11/2021   ALT 11 02/11/2021   PROT 6.6 02/11/2021   ALBUMIN 3.6 02/11/2021   CALCIUM 8.9 02/11/2021   ANIONGAP 11 06/01/2020   GFR 106.49 02/11/2021   Lab Results  Component Value Date   CHOL 227 (H) 02/11/2021   Lab Results  Component Value Date   HDL 61.60 02/11/2021   Lab Results  Component Value Date   LDLCALC 144 (H) 02/11/2021   Lab Results  Component Value Date   TRIG 108.0 02/11/2021   Lab Results  Component Value Date   CHOLHDL 4 02/11/2021   Lab Results  Component Value Date   HGBA1C 5.2 05/28/2020       Assessment & Plan:   1. Viral URI with cough Continue supportive measures including rest, hydration, humidifier use, steam showers, warm compresses to sinuses, warm liquids with lemon and honey, and over-the-counter cough, cold, and analgesics as needed.   Adding prednisone, Flonase, and Mucinex.   If no better before the weekend, please let us know so we can consider antibiotics.    - fluticasone (FLONASE) 50 MCG/ACT nasal spray; Place 2 sprays into both nostrils daily.  Dispense: 1 g; Refill: 0 - predniSONE (DELTASONE) 20 MG tablet; Take 2 tablets (40 mg total) by mouth daily with breakfast for 5 days.  Dispense: 10 tablet; Refill: 0 - guaiFENesin (MUCINEX) 600 MG 12 hr tablet; Take 2 tablets (1,200 mg total) by mouth 2 (two) times daily.  Dispense: 30 tablet; Refill: 2 - albuterol inhaler    Follow-up if symptoms worsen or fail to improve.   Terrilyn Saver, NP

## 2021-05-27 NOTE — Patient Instructions (Signed)
Continue supportive measures including rest, hydration, humidifier use, steam showers, warm compresses to sinuses, warm liquids with lemon and honey, and over-the-counter cough, cold, and analgesics as needed.   Adding prednisone, Flonase, and Mucinex.   If no better before the weekend, please let us know so we can consider antibiotics.

## 2021-05-28 ENCOUNTER — Telehealth: Payer: Self-pay | Admitting: Family

## 2021-05-28 NOTE — Telephone Encounter (Signed)
Pt was seen by taylor yesterday, and was prescribed  albuterol (VENTOLIN HFA) 108 (90 Base) MCG/ACT inhaler  she states pharmacy was missing some information, for insurance purposes and they have faxed that information over. Please advise.

## 2021-05-28 NOTE — Telephone Encounter (Signed)
Per pharmacist the rx needs pa

## 2021-05-29 ENCOUNTER — Telehealth: Payer: Self-pay

## 2021-05-29 NOTE — Telephone Encounter (Signed)
PA initiated

## 2021-05-29 NOTE — Telephone Encounter (Signed)
PA initiated for albuterol

## 2021-05-31 ENCOUNTER — Telehealth: Payer: Self-pay

## 2021-06-01 DIAGNOSIS — R042 Hemoptysis: Secondary | ICD-10-CM | POA: Diagnosis not present

## 2021-06-01 DIAGNOSIS — Z20822 Contact with and (suspected) exposure to covid-19: Secondary | ICD-10-CM | POA: Diagnosis not present

## 2021-06-01 DIAGNOSIS — R079 Chest pain, unspecified: Secondary | ICD-10-CM | POA: Diagnosis not present

## 2021-06-01 DIAGNOSIS — J189 Pneumonia, unspecified organism: Secondary | ICD-10-CM | POA: Diagnosis not present

## 2021-06-02 DIAGNOSIS — R079 Chest pain, unspecified: Secondary | ICD-10-CM | POA: Diagnosis not present

## 2021-06-03 ENCOUNTER — Telehealth: Payer: Self-pay | Admitting: *Deleted

## 2021-06-03 NOTE — Telephone Encounter (Signed)
Procedure scheduled 07/02/21  Pt. Had ov with pcp on 05/27/21 wt at that time was 310 lbs,which puts her BMI at 54.16,please advise of how you would like to proceed,thank you.

## 2021-06-04 NOTE — Telephone Encounter (Signed)
Please see note below and advise  

## 2021-06-04 NOTE — Telephone Encounter (Signed)
Pt was notified of the of her current BMI and the criteria of the [place] and that she would have to have her colonoscopy at the hospital. Pt was also notified that at this point that Dr. Carlean Purl does not have any availability at the Hospital: Pt was notified as soon as Dr. Carlean Purl has availability that we would reach out to her:  Pt was also notified if she looses weight to contact us as she may be a candidate for the [place]: Pt verbalized understanding with all questions answered.

## 2021-06-05 ENCOUNTER — Ambulatory Visit: Payer: BC Managed Care – PPO | Admitting: Family

## 2021-06-05 ENCOUNTER — Ambulatory Visit (HOSPITAL_BASED_OUTPATIENT_CLINIC_OR_DEPARTMENT_OTHER)
Admission: RE | Admit: 2021-06-05 | Discharge: 2021-06-05 | Disposition: A | Discharge status: Home / Self Care | Payer: BC Managed Care – PPO | Source: Ambulatory Visit | Attending: Family | Admitting: Family

## 2021-06-05 ENCOUNTER — Other Ambulatory Visit: Payer: Self-pay

## 2021-06-05 VITALS — BP 119/70 | HR 73 | Temp 98.0°F | Resp 16 | Wt 308.0 lb

## 2021-06-05 DIAGNOSIS — J189 Pneumonia, unspecified organism: Secondary | ICD-10-CM | POA: Insufficient documentation

## 2021-06-05 DIAGNOSIS — R739 Hyperglycemia, unspecified: Secondary | ICD-10-CM

## 2021-06-05 DIAGNOSIS — E876 Hypokalemia: Secondary | ICD-10-CM

## 2021-06-05 HISTORY — DX: Pneumonia, unspecified organism: J18.9

## 2021-06-05 LAB — CBC WITH DIFFERENTIAL/PLATELET
Basophils Absolute: 0.1 10*3/uL (ref 0.0–0.1)
Basophils Relative: 1.1 % (ref 0.0–3.0)
Eosinophils Absolute: 0.2 10*3/uL (ref 0.0–0.7)
Eosinophils Relative: 2.9 % (ref 0.0–5.0)
HCT: 36 % (ref 36.0–46.0)
Hemoglobin: 11.8 g/dL — ABNORMAL LOW (ref 12.0–15.0)
Lymphocytes Relative: 18 % (ref 12.0–46.0)
Lymphs Abs: 1.3 10*3/uL (ref 0.7–4.0)
MCHC: 32.8 g/dL (ref 30.0–36.0)
MCV: 87 fl (ref 78.0–100.0)
Monocytes Absolute: 0.5 10*3/uL (ref 0.1–1.0)
Monocytes Relative: 7.2 % (ref 3.0–12.0)
Neutro Abs: 5.1 10*3/uL (ref 1.4–7.7)
Neutrophils Relative %: 70.8 % (ref 43.0–77.0)
Platelets: 318 10*3/uL (ref 150.0–400.0)
RBC: 4.14 Mil/uL (ref 3.87–5.11)
RDW: 13.7 % (ref 11.5–15.5)
WBC: 7.2 10*3/uL (ref 4.0–10.5)

## 2021-06-05 LAB — HEMOGLOBIN A1C: Hgb A1c MFr Bld: 5.7 % (ref 4.6–6.5)

## 2021-06-05 IMAGING — DX DG CHEST 2V
2 series · 2 of 2 positions shown · non-contrast
Comparison: Chest CT dated [DATE].

CLINICAL DATA: Follow-up pneumonia.

EXAM:
CHEST - 2 VIEW

[chest pa]
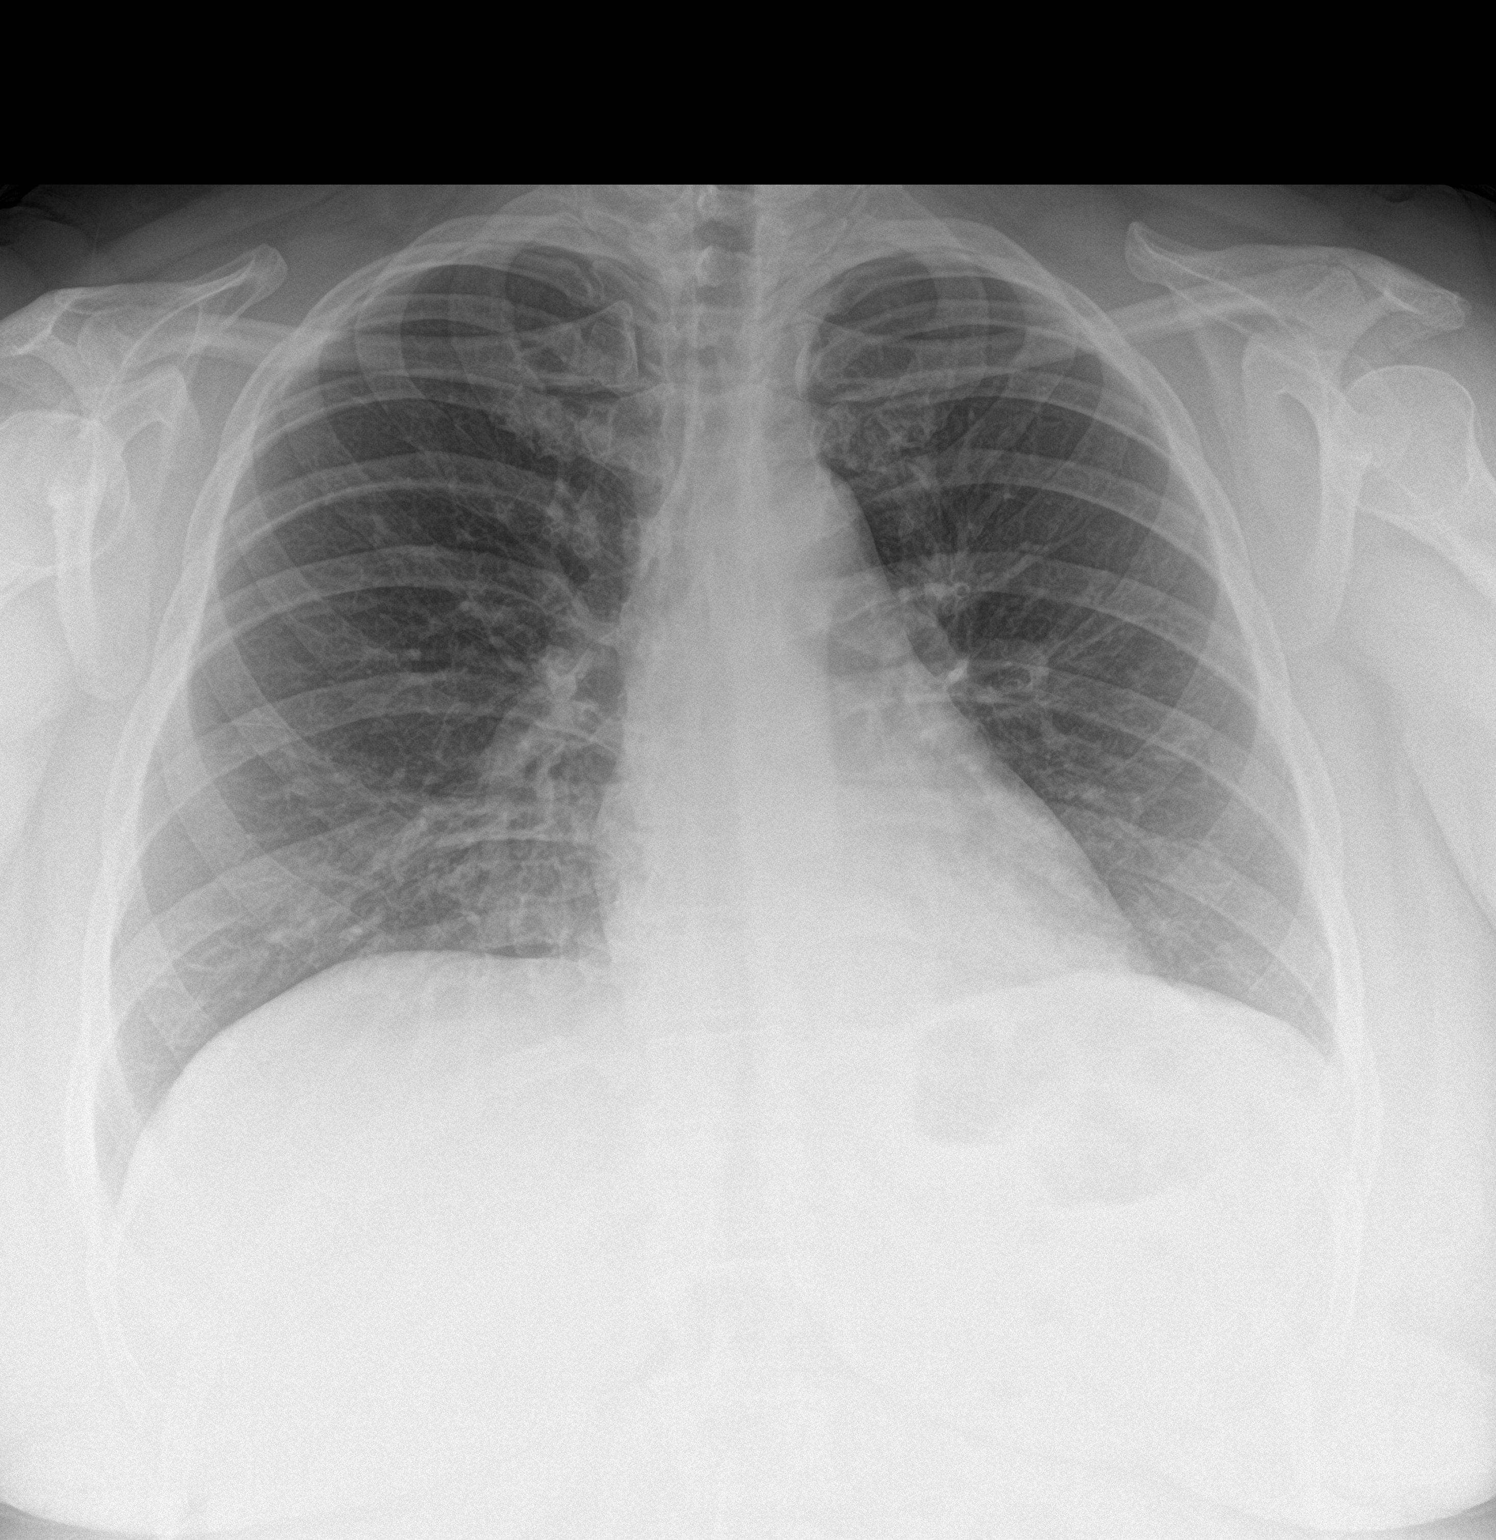

[chest lat]
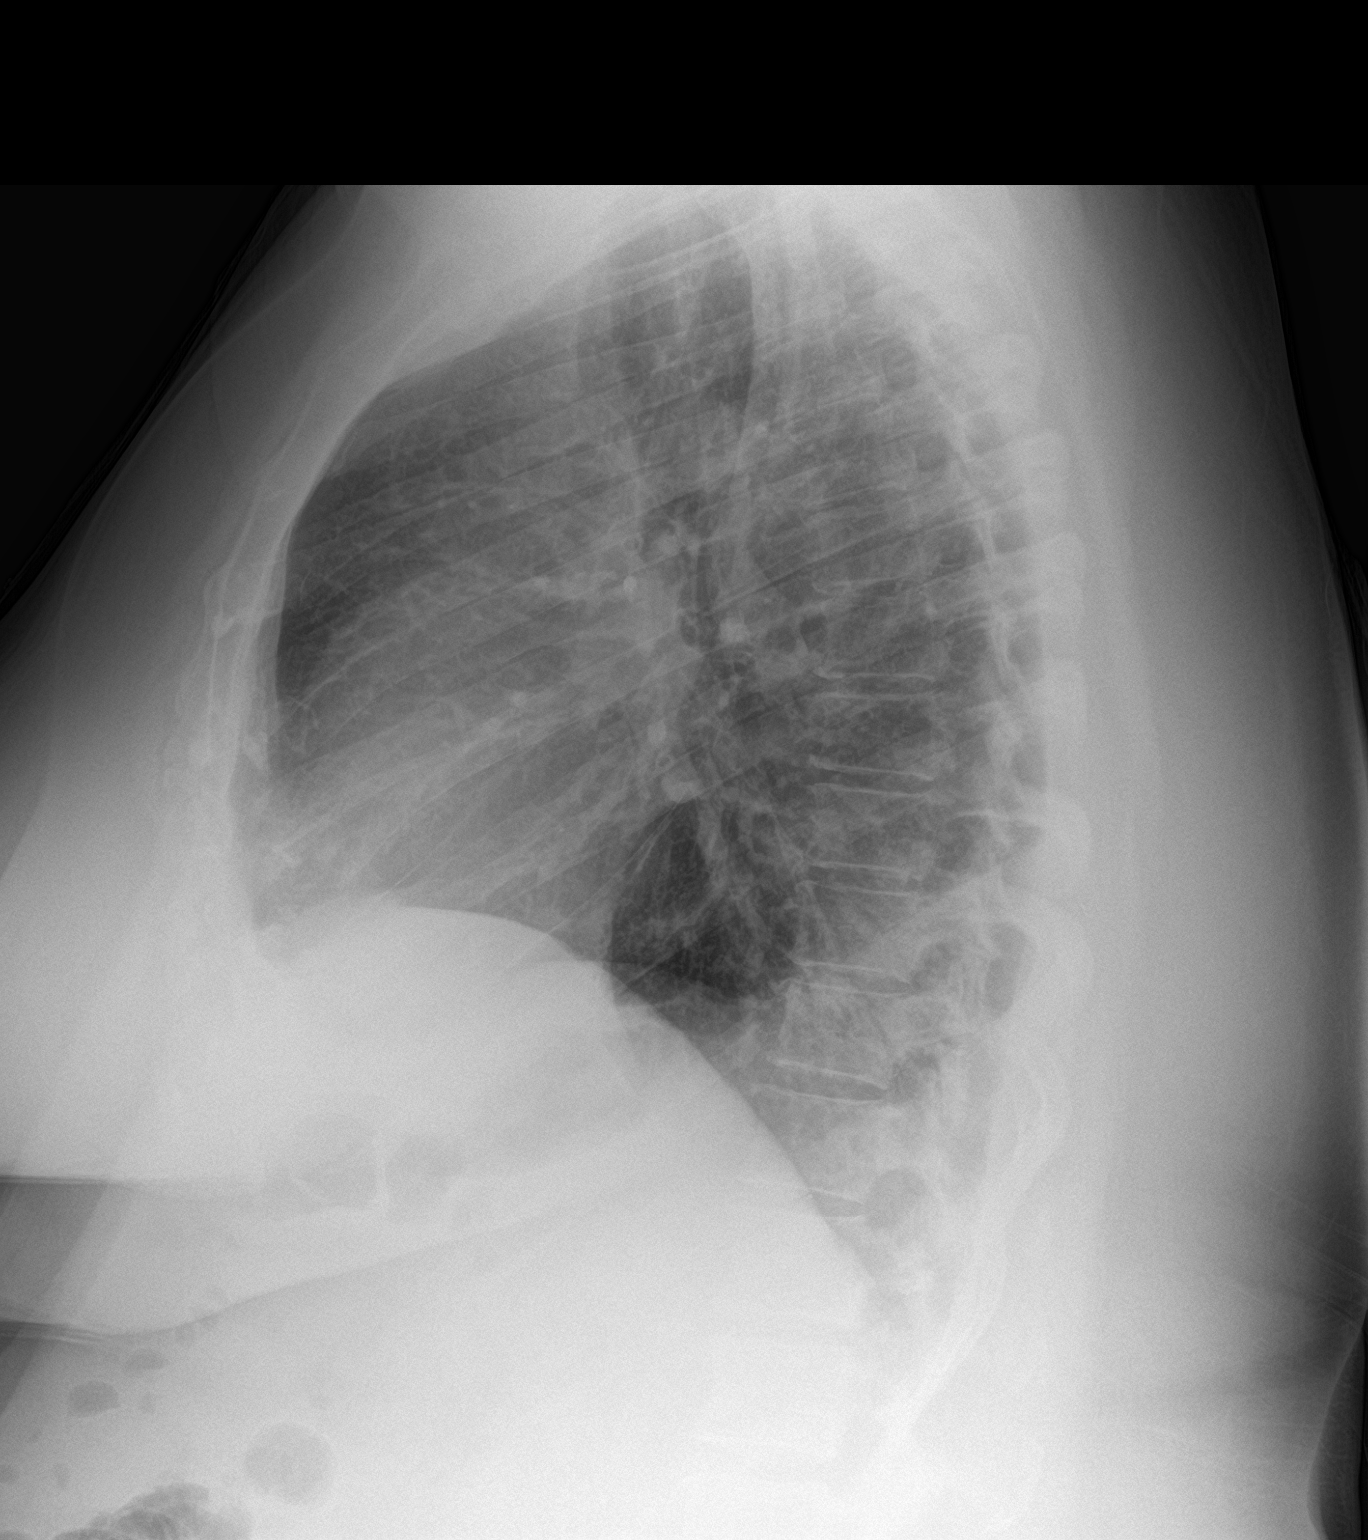

[2 of 2 positions shown; findings below may reference images not displayed]

FINDINGS: Minimal left lung base streaky density, likely atelectasis. No
pleural effusion or pneumothorax. The cardiac silhouette is within
normal limits. No acute osseous pathology.
IMPRESSION: Probable minimal left lung base atelectasis.

## 2021-06-05 NOTE — Assessment & Plan Note (Signed)
New. Improving. I think that her nausea is likely related to the augmentin, but she does not feel that it is bad enough to discontinue the antibiotic. In the meantime, she will complete augmentin and we will obtain a follow up CXR.

## 2021-06-05 NOTE — Assessment & Plan Note (Signed)
Completed 5 day course of oral potassium. Was likely due to acute illness and poor po intake. Will repeat K+.

## 2021-06-05 NOTE — Patient Instructions (Signed)
Please complete lab work prior to leaving. Complete chest x-ray on the first floor. Complete your antibiotics. Call if symptoms do not continue to improve.

## 2021-06-05 NOTE — Progress Notes (Signed)
Subjective:   By signing my name below, I, Elizabeth Dixon, attest that this documentation has been prepared under the direction and in the presence of Elizabeth Alar, NP 06/05/2021   Patient ID: Elizabeth Dixon, female    DOB: [birth year], 46 y.o.   MRN: [id number]  Chief Complaint  Patient presents with   Follow-up    Here for hospital follow up    HPI Patient is in today for an ER follow-up.  She presented to the ER on 06/01/2021 for left-sided pleuritic chest pain with associated hemoptysis. She has a history of breat cancer but is not on chemotherapy or radiation. She was also seen last week for a URI and was given prednisone. Negative for Covid-19 and flu but was treated for community acquired pneumonia with azithromycin.  Today, she reports the hemoptysis has resolved, there is still blood streaking in the mucus she is coughing up. She is still experiencing dyspnea on exertion, nausea,mild dizziness and sinus pressure on the right side of her nose and face.    Past Medical History:  Diagnosis Date   Anemia    Breast cancer ([place]) 2016   left breast- ductal carcinoma in situ   Family history of breast cancer    Family history of prostate cancer    Hyperlipidemia    hx of   Iron deficiency anemia    hx of   Multiple sclerosis ([place])    dx 2002   Neuromuscular disorder [facility])     Past Surgical History:  Procedure Laterality Date   BREAST LUMPECTOMY Left 08/2014   BREAST REDUCTION SURGERY Bilateral 08/2014   CESAREAN SECTION  2013   FLEXIBLE SIGMOIDOSCOPY N/A 05/31/2020   Procedure: FLEXIBLE SIGMOIDOSCOPY, ICG INTRAOP ASSESSMENT OF PERFUSION;  Surgeon: Ileana Roup, MD;  Location: [facility];  Service: General;  Laterality: N/A;   SIGMOIDECTOMY  05/31/2020   XI ROBOT ASSISTED SIGMOID COLECTOMY WITH COLORECTAL ANASTOMOSIS   VAGINA SURGERY     vagina tear from childbirth   WISDOM TOOTH EXTRACTION      Family History  Problem Relation Age of Onset   Thyroid disease  Mother    Hypertension Father    Prostate cancer Father 50   Skin cancer Father 26   Parkinson's disease Paternal Grandmother    Breast cancer Other        unconfirmed diagnosis, great-aunt (MGF's sister)   Colon cancer Neg Hx    Colon polyps Neg Hx    Esophageal cancer Neg Hx    Stomach cancer Neg Hx    Rectal cancer Neg Hx     Social History   Socioeconomic History   Marital status: Married    Spouse name: Not on file   Number of children: Not on file   Years of education: Not on file   Highest education level: Not on file  Occupational History   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  Tobacco Use   Smoking status: Former    Types: Cigarettes    Quit date: 2006    Years since quitting: 17.1   Smokeless tobacco: Never  Vaping Use   Vaping Use: Never used  Substance and Sexual Activity   Alcohol use: Yes    Alcohol/week: 1.0 standard drink    Types: 1 Standard drinks or equivalent per week    Comment: once a wk   Drug use: Never   Sexual activity: Yes    Partners: Male  Other Topics Concern   Not on file  Social History  Narrative   Works as an Geologist, engineering   Married-  [place] 2013 son and 2011 daughter   Moved from [place]   Enjoys children's sports   Enjoys outside activities   dog   Complete bachelors degree      Right handed   Social Determinants of Health   Financial Resource Strain: Not on file  Food Insecurity: Not on file  Transportation Needs: Not on file  Physical Activity: Not on file  Stress: Not on file  Social Connections: Not on file  Intimate Partner Violence: Not on file    Outpatient Medications Prior to Visit  Medication Sig Dispense Refill   albuterol (VENTOLIN HFA) 108 (90 Base) MCG/ACT inhaler Inhale 2 puffs into the lungs every 6 (six) hours as needed for wheezing. 2 each 11   amoxicillin-clavulanate (AUGMENTIN) 875-125 MG tablet Take by mouth.     azithromycin (ZITHROMAX) 250 MG tablet Take by mouth daily.     cholecalciferol (VITAMIN D3) 25 MCG  (1000 UNIT) tablet Take 1,000 Units by mouth daily.     fluticasone (FLONASE) 50 MCG/ACT nasal spray Place 2 sprays into both nostrils daily. 1 g 0   guaiFENesin (MUCINEX) 600 MG 12 hr tablet Take 2 tablets (1,200 mg total) by mouth 2 (two) times daily. 30 tablet 2   HYDROCODONE-HOMATROPINE PO Take by mouth.     Multiple Vitamin (MULTIVITAMIN) capsule Take 1 capsule by mouth daily.     Potassium Chloride ER 20 MEQ TBCR Take by mouth.     zinc gluconate 50 MG tablet Take 50 mg by mouth daily.     No facility-administered medications prior to visit.    No Known Allergies  Review of Systems  Constitutional:  Negative for fever.  HENT:  Positive for sinus pain. Negative for ear pain and hearing loss.        (-)nystagmus (-)adenopathy  Eyes:  Negative for blurred vision.  Respiratory:  Positive for cough and shortness of breath. Negative for wheezing.   Cardiovascular:  Negative for chest pain and leg swelling.  Gastrointestinal:  Positive for nausea and vomiting. Negative for blood in stool and diarrhea.  Genitourinary:  Negative for dysuria and frequency.  Musculoskeletal:  Negative for joint pain and myalgias.  Skin:  Negative for rash.  Neurological:  Negative for headaches.  Psychiatric/Behavioral:  Negative for depression. The patient is not nervous/anxious.       Objective:    Physical Exam Constitutional:      General: She is not in acute distress.    Appearance: Normal appearance. She is not ill-appearing.  HENT:     Head: Normocephalic and atraumatic.     Right Ear: External ear normal.     Left Ear: External ear normal.  Eyes:     Extraocular Movements: Extraocular movements intact.     Pupils: Pupils are equal, round, and reactive to light.  Cardiovascular:     Rate and Rhythm: Normal rate and regular rhythm.     Pulses: Normal pulses.     Heart sounds: Normal heart sounds. No murmur heard. Pulmonary:     Effort: Pulmonary effort is normal. No respiratory  distress.     Breath sounds: Normal breath sounds. No wheezing or rhonchi.  Abdominal:     General: Bowel sounds are normal. There is no distension.     Palpations: Abdomen is soft.     Tenderness: There is no abdominal tenderness. There is no guarding or rebound.  Musculoskeletal:  Cervical back: Neck supple.  Lymphadenopathy:     Cervical: No cervical adenopathy.  Skin:    General: Skin is warm and dry.  Neurological:     Mental Status: She is alert and oriented to person, place, and time.  Psychiatric:        Behavior: Behavior normal.        Judgment: Judgment normal.    BP 119/70 (BP Location: Right Arm, Patient Position: Sitting, Cuff Size: Large)    Pulse 73    Temp 98 F (36.7 C) (Oral)    Resp 16    Wt (!) 308 lb (139.7 kg)    LMP 05/29/2021    SpO2 100%    BMI 53.70 kg/m  Wt Readings from Last 3 Encounters:  06/05/21 (!) 308 lb (139.7 kg)  05/27/21 (!) 310 lb 9.6 oz (140.9 kg)  02/11/21 (!) 307 lb (139.3 kg)    Diabetic Foot Exam - Simple   No data filed    Lab Results  Component Value Date   WBC 8.5 06/01/2020   HGB 11.5 (L) 06/01/2020   HCT 36.1 06/01/2020   PLT 212 06/01/2020   GLUCOSE 93 02/11/2021   CHOL 227 (H) 02/11/2021   TRIG 108.0 02/11/2021   HDL 61.60 02/11/2021   LDLCALC 144 (H) 02/11/2021   ALT 11 02/11/2021   AST 13 02/11/2021   NA 137 02/11/2021   K 4.4 02/11/2021   CL 101 02/11/2021   CREATININE 0.62 02/11/2021   BUN 14 02/11/2021   CO2 29 02/11/2021   TSH 1.77 02/01/2019   INR 0.9 05/28/2020   HGBA1C 5.2 05/28/2020    Lab Results  Component Value Date   TSH 1.77 02/01/2019   Lab Results  Component Value Date   WBC 8.5 06/01/2020   HGB 11.5 (L) 06/01/2020   HCT 36.1 06/01/2020   MCV 91.9 06/01/2020   PLT 212 06/01/2020   Lab Results  Component Value Date   NA 137 02/11/2021   K 4.4 02/11/2021   CO2 29 02/11/2021   GLUCOSE 93 02/11/2021   BUN 14 02/11/2021   CREATININE 0.62 02/11/2021   BILITOT 0.5 02/11/2021    ALKPHOS 86 02/11/2021   AST 13 02/11/2021   ALT 11 02/11/2021   PROT 6.6 02/11/2021   ALBUMIN 3.6 02/11/2021   CALCIUM 8.9 02/11/2021   ANIONGAP 11 06/01/2020   GFR 106.49 02/11/2021   Lab Results  Component Value Date   CHOL 227 (H) 02/11/2021   Lab Results  Component Value Date   HDL 61.60 02/11/2021   Lab Results  Component Value Date   LDLCALC 144 (H) 02/11/2021   Lab Results  Component Value Date   TRIG 108.0 02/11/2021   Lab Results  Component Value Date   CHOLHDL 4 02/11/2021   Lab Results  Component Value Date   HGBA1C 5.2 05/28/2020       Assessment & Plan:   Problem List Items Addressed This Visit       Unprioritized   Hypokalemia    Completed 5 day course of oral potassium. Was likely due to acute illness and poor po intake. Will repeat K+.       Hyperglycemia - Primary   Relevant Orders   Hemoglobin A1c   Community acquired pneumonia    New. Improving. I think that her nausea is likely related to the augmentin, but she does not feel that it is bad enough to discontinue the antibiotic. In the meantime, she will complete augmentin  and we will obtain a follow up CXR.       Relevant Medications   amoxicillin-clavulanate (AUGMENTIN) 875-125 MG tablet   azithromycin (ZITHROMAX) 250 MG tablet   HYDROCODONE-HOMATROPINE PO   Other Relevant Orders   Comp Met (CMET)   CBC with Differential/Platelet   DG Chest 2 View    No orders of the defined types were placed in this encounter.   I,Elizabeth Dixon,acting as a Education administrator for [employer], NP.,have documented all relevant documentation on the behalf of Nance Pear, NP,as directed by  Nance Pear, NP while in the presence of Nance Pear, NP.   I, Elizabeth Alar, NP , personally preformed the services described in this documentation.  All medical record entries made by the scribe were at my direction and in my presence.  I have reviewed the chart and discharge  instructions (if applicable) and agree that the record reflects my personal performance and is accurate and complete. 06/05/2021

## 2021-06-06 ENCOUNTER — Telehealth: Payer: Self-pay | Admitting: Family

## 2021-06-06 DIAGNOSIS — E8809 Other disorders of plasma-protein metabolism, not elsewhere classified: Secondary | ICD-10-CM

## 2021-06-06 DIAGNOSIS — D649 Anemia, unspecified: Secondary | ICD-10-CM

## 2021-06-06 LAB — COMPREHENSIVE METABOLIC PANEL
ALT: 15 U/L (ref 0–35)
AST: 12 U/L (ref 0–37)
Albumin: 3.1 g/dL — ABNORMAL LOW (ref 3.5–5.2)
Alkaline Phosphatase: 80 U/L (ref 39–117)
BUN: 12 mg/dL (ref 6–23)
CO2: 36 mEq/L — ABNORMAL HIGH (ref 19–32)
Calcium: 8.5 mg/dL (ref 8.4–10.5)
Chloride: 99 mEq/L (ref 96–112)
Creatinine, Ser: 0.6 mg/dL (ref 0.40–1.20)
GFR: 107.1 mL/min (ref >60.00)
Glucose, Bld: 89 mg/dL (ref 70–99)
Potassium: 3.6 mEq/L (ref 3.5–5.1)
Sodium: 141 mEq/L (ref 135–145)
Total Bilirubin: 0.3 mg/dL (ref 0.2–1.2)
Total Protein: 6.2 g/dL (ref 6.0–8.3)

## 2021-06-06 MED ORDER — MULTI-VITAMIN/MINERALS PO TABS: 1.0000 | ORAL_TABLET | Freq: Every day | ORAL | Status: AC

## 2021-06-06 NOTE — Telephone Encounter (Signed)
Called  pt and schedule lab appt

## 2021-06-06 NOTE — Telephone Encounter (Signed)
Protein level in her blood is a little low. Please work on increasing lean proteins in here diet.  Repeat labs as ordered in 1 month.  She is mildly anemic. Please make sure that she is taking a multivitamin with minerals once daily.

## 2021-06-10 ENCOUNTER — Telehealth: Payer: Self-pay | Admitting: *Deleted

## 2021-06-10 MED ORDER — LEVALBUTEROL TARTRATE 45 MCG/ACT IN AERO: 2.0000 | INHALATION_SPRAY | RESPIRATORY_TRACT | 3 refills | Status: DC | PRN

## 2021-06-10 NOTE — Telephone Encounter (Signed)
Patient saw you on 05/27/21 and pharmacy sent over a prior auth for the albuterol (Ventolin).  Not sure why they are sending just now.  It states that other albuterol sulfates HFA (did not specify a particular brand) and levalbuterol tartrate do not require prior auth. Do you want to change to something else?

## 2021-06-11 ENCOUNTER — Ambulatory Visit (HOSPITAL_BASED_OUTPATIENT_CLINIC_OR_DEPARTMENT_OTHER)
Admission: RE | Admit: 2021-06-11 | Discharge: 2021-06-11 | Disposition: A | Discharge status: Home / Self Care | Payer: BC Managed Care – PPO | Source: Ambulatory Visit | Attending: Family | Admitting: Family

## 2021-06-11 ENCOUNTER — Other Ambulatory Visit: Payer: Self-pay

## 2021-06-11 ENCOUNTER — Ambulatory Visit: Payer: BC Managed Care – PPO | Admitting: Family

## 2021-06-11 VITALS — BP 122/66 | HR 99 | Temp 98.4°F | Resp 16 | Wt 308.0 lb

## 2021-06-11 DIAGNOSIS — J90 Pleural effusion, not elsewhere classified: Secondary | ICD-10-CM | POA: Diagnosis not present

## 2021-06-11 DIAGNOSIS — J189 Pneumonia, unspecified organism: Secondary | ICD-10-CM

## 2021-06-11 DIAGNOSIS — R000 Tachycardia, unspecified: Secondary | ICD-10-CM | POA: Diagnosis not present

## 2021-06-11 DIAGNOSIS — R0602 Shortness of breath: Secondary | ICD-10-CM | POA: Diagnosis not present

## 2021-06-11 IMAGING — DX DG CHEST 2V
2 series · 2 of 2 positions shown · non-contrast
Comparison: [DATE]

CLINICAL DATA: Increased shortness of breath, recent pneumonia

EXAM:
CHEST - 2 VIEW

[chest pa]
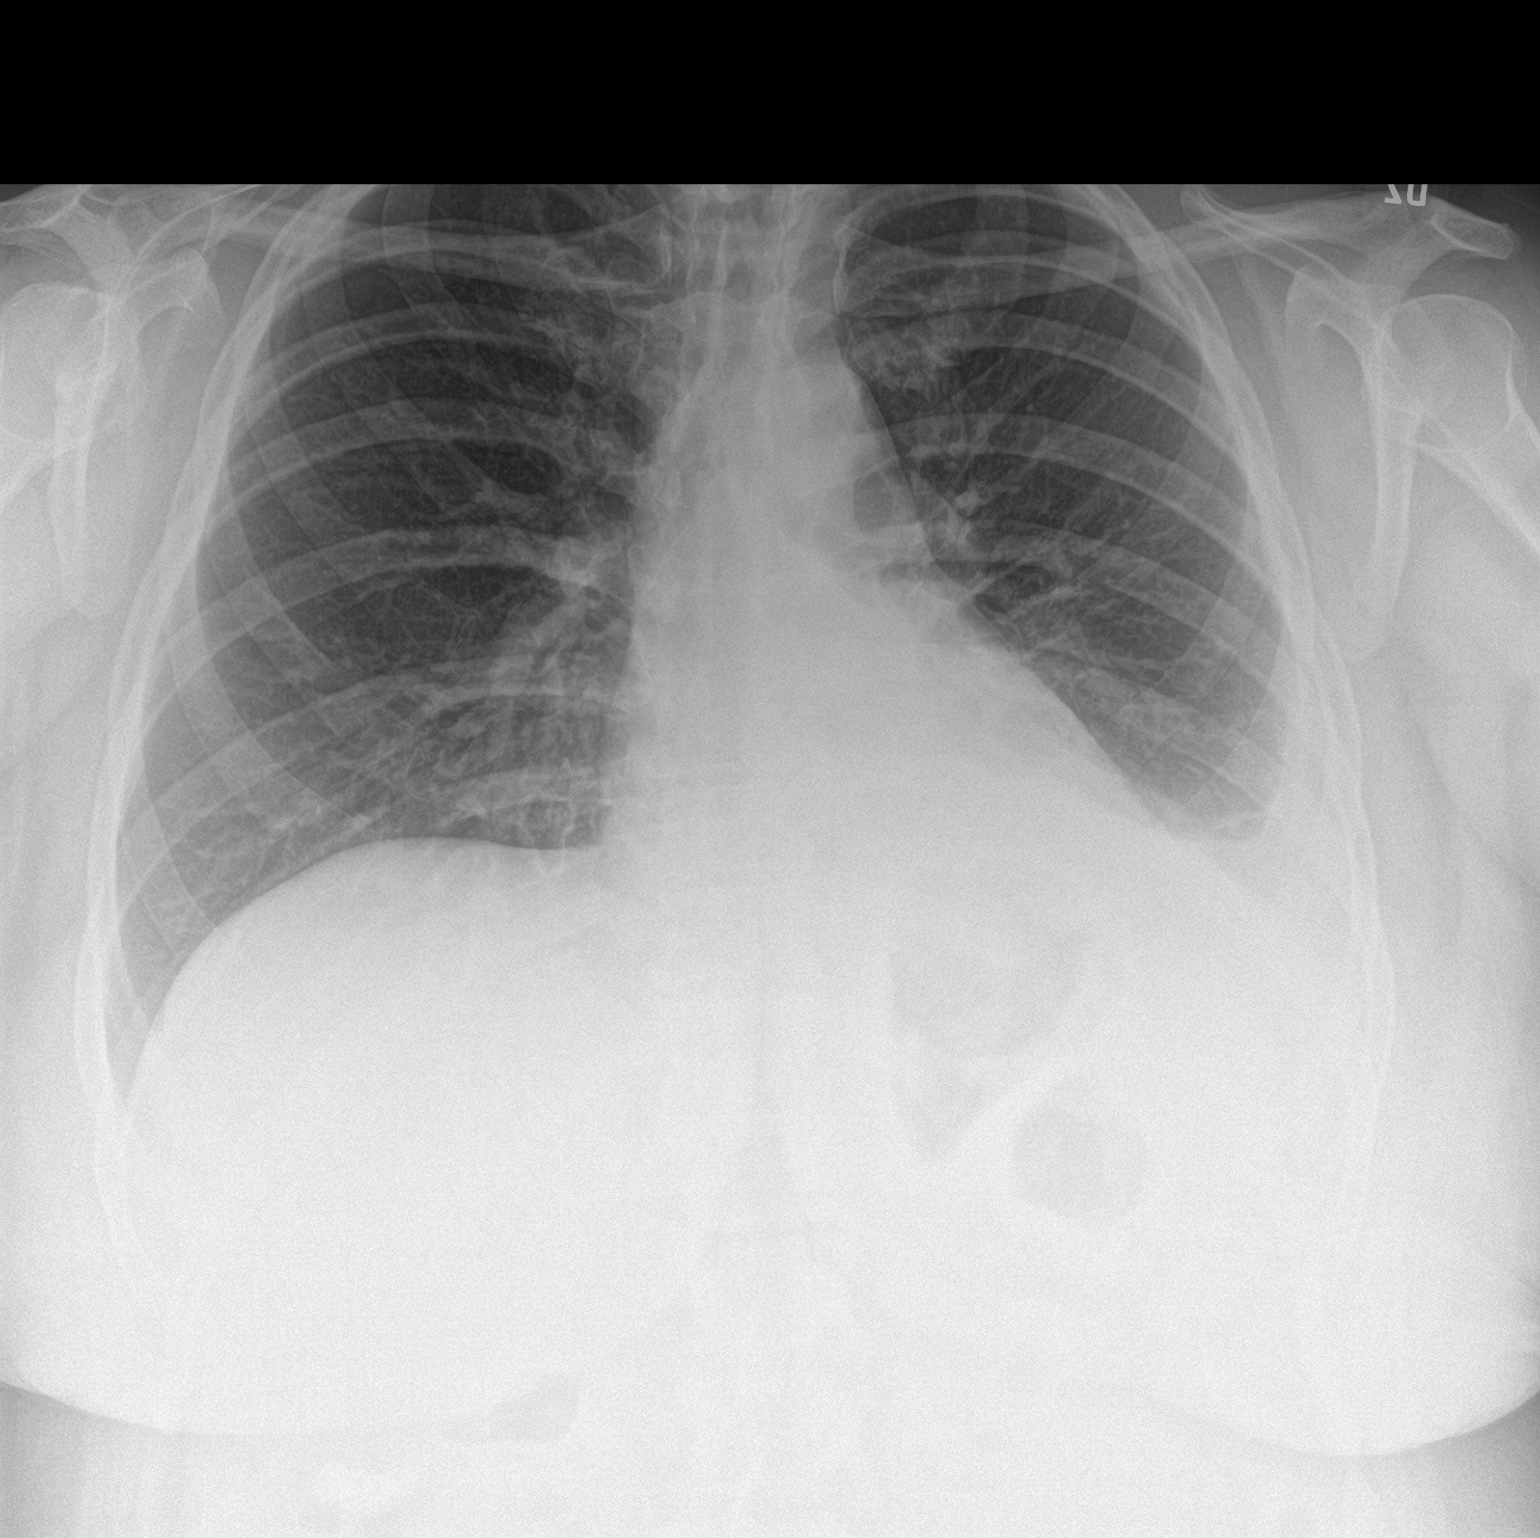

[chest lat]
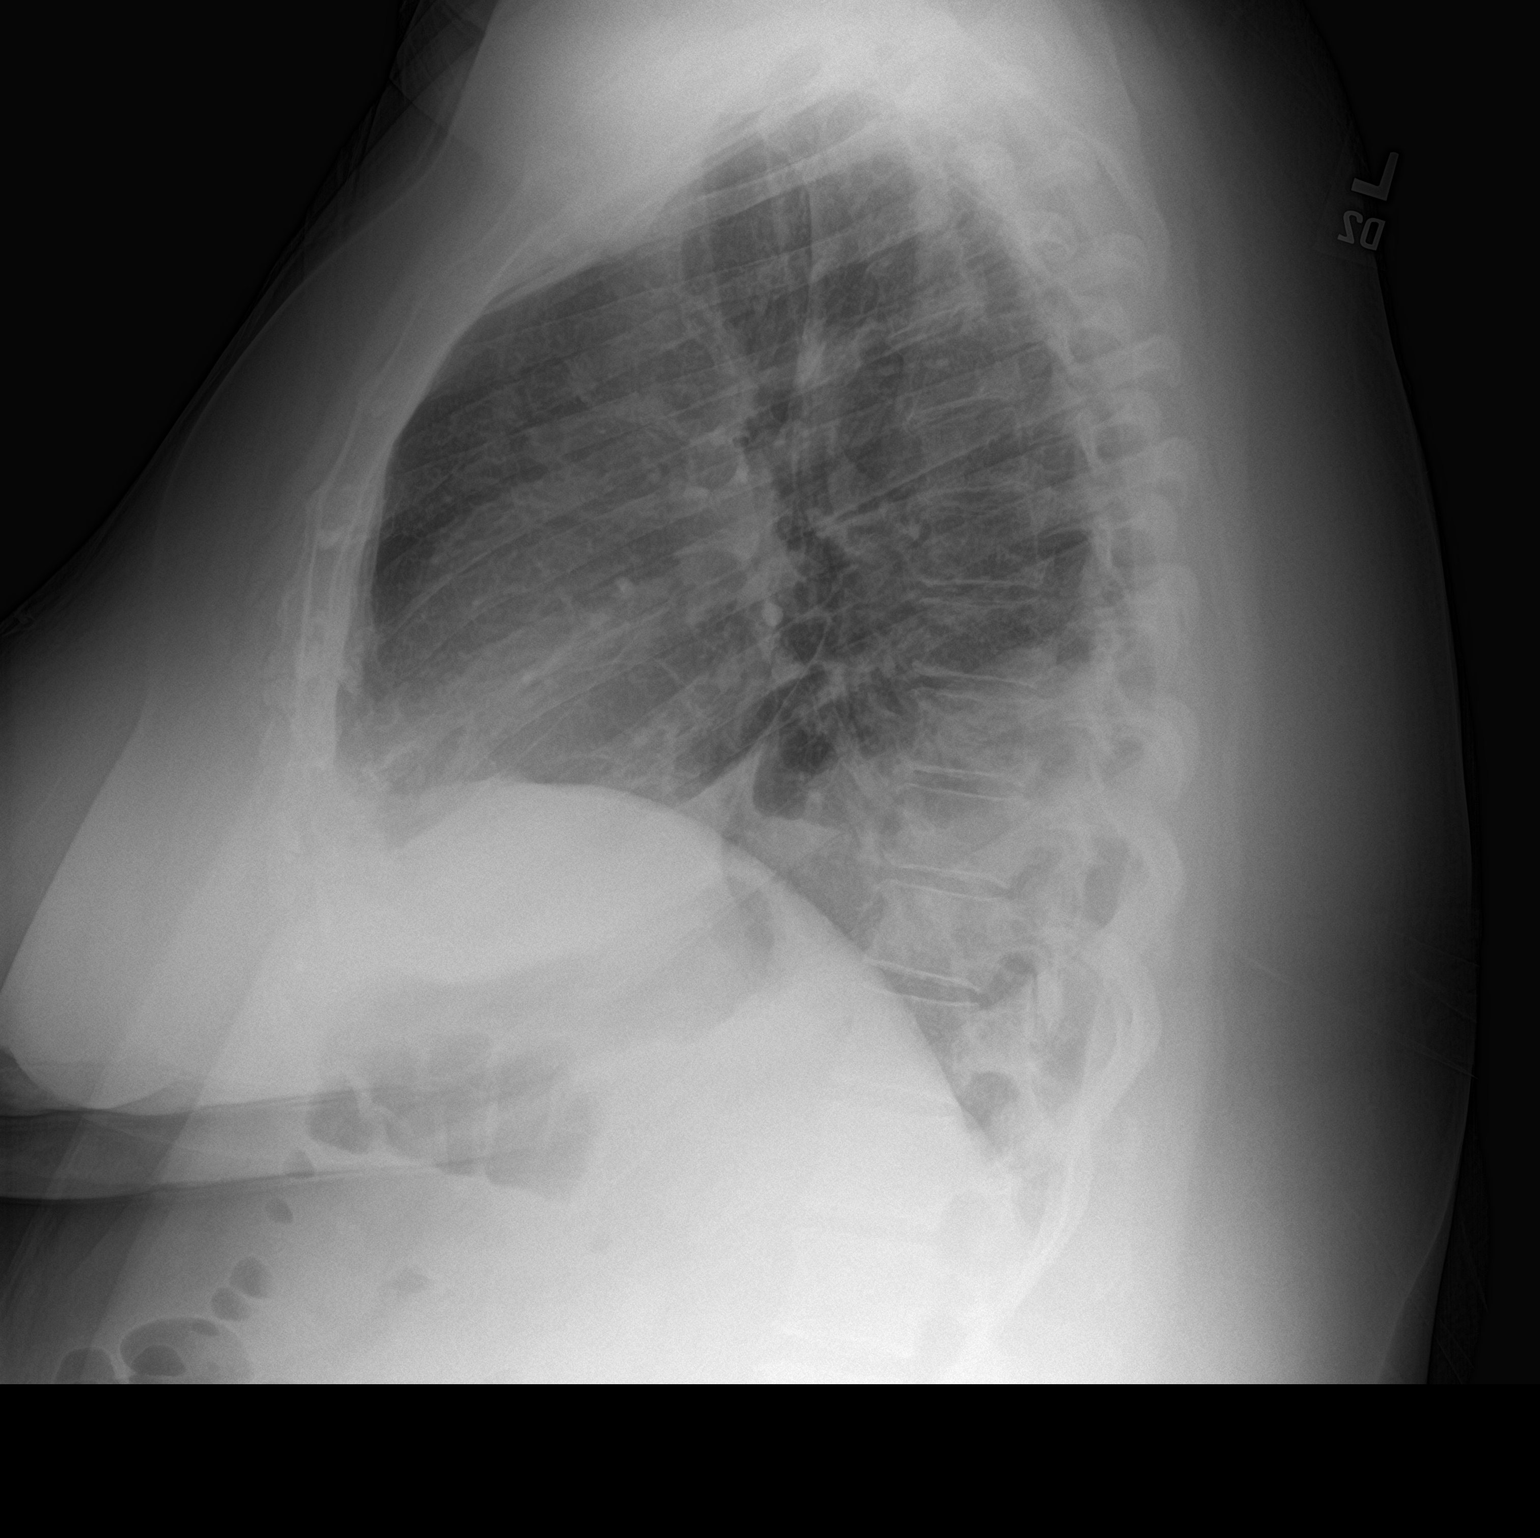

[2 of 2 positions shown; findings below may reference images not displayed]

FINDINGS: Normal heart size, mediastinal contours, and pulmonary vascularity.

Increased atelectasis versus consolidation of LEFT lower lobe.

New LEFT pleural effusion.

Remaining lungs clear.

No pneumothorax or acute osseous findings.
IMPRESSION: Increased atelectasis versus consolidation of LEFT lower lobe with
associated new LEFT pleural effusion.

## 2021-06-11 NOTE — Assessment & Plan Note (Signed)
Now with SOB, worsening CXR and pleural effusion. I spoke with the patient. Since she has failed outpatient treatment, I think she needs eval to include CT scan and possible thoracentesis as well as IV abx.  Pt verbalizes understanding and plans to go to the [facility] near her home tonight.

## 2021-06-11 NOTE — Progress Notes (Signed)
Subjective:     Patient ID: Elizabeth Dixon, female    DOB: 20-May-1975, 46 y.o.   MRN: [id number]  Chief Complaint  Patient presents with   Pneumonia    Here for follow up   Hypokalemia    Here for follow up    HPI  Patient presents today for follow up of her pneumonia. Last visit she noted improvement in her symptoms and also CXR showed improvement. She completed her antibiotics. Notes that the nausea she was experiencing last visit resolved when she discontinued mucinex. 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 and felt that the left side of her lung "did not look good."    Health Maintenance Due  Topic Date Due   URINE MICROALBUMIN  Never done   COVID-19 Vaccine (2 - Janssen risk series) 07/29/2019   COLONOSCOPY (Pts 45-55yrs Insurance coverage will need to be confirmed)  04/23/2021    Past Medical History:  Diagnosis Date   Anemia    Breast cancer ([place]) 2016   left breast- ductal carcinoma in situ   Family history of breast cancer    Family history of prostate cancer    Hyperlipidemia    hx of   Iron deficiency anemia    hx of   Multiple sclerosis ([place])    dx 2002   Neuromuscular disorder [facility])     Past Surgical History:  Procedure Laterality Date   BREAST LUMPECTOMY Left 08/2014   BREAST REDUCTION SURGERY Bilateral 08/2014   CESAREAN SECTION  2013   FLEXIBLE SIGMOIDOSCOPY N/A 05/31/2020   Procedure: FLEXIBLE SIGMOIDOSCOPY, ICG INTRAOP ASSESSMENT OF PERFUSION;  Surgeon: Ileana Roup, MD;  Location: [facility];  Service: General;  Laterality: N/A;   SIGMOIDECTOMY  05/31/2020   XI ROBOT ASSISTED SIGMOID COLECTOMY WITH COLORECTAL ANASTOMOSIS   VAGINA SURGERY     vagina tear from childbirth   WISDOM TOOTH EXTRACTION      Family History  Problem Relation Age of Onset   Thyroid disease Mother    Hypertension Father    Prostate cancer Father 22   Skin cancer Father 65   Parkinson's disease Paternal Grandmother    Breast  cancer Other        unconfirmed diagnosis, great-aunt (MGF's sister)   Colon cancer Neg Hx    Colon polyps Neg Hx    Esophageal cancer Neg Hx    Stomach cancer Neg Hx    Rectal cancer Neg Hx     Social History   Socioeconomic History   Marital status: Married    Spouse name: Not on file   Number of children: Not on file   Years of education: Not on file   Highest education level: Not on file  Occupational History   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  Tobacco Use   Smoking status: Former    Types: Cigarettes    Quit date: 2006    Years since quitting: 17.1   Smokeless tobacco: Never  Vaping Use   Vaping Use: Never used  Substance and Sexual Activity   Alcohol use: Yes    Alcohol/week: 1.0 standard drink    Types: 1 Standard drinks or equivalent per week    Comment: once a wk   Drug use: Never   Sexual activity: Yes    Partners: Male  Other Topics Concern   Not on file  Social History Narrative   Works as an Geologist, engineering   Married-  Children  2013 son and 2011 daughter   Moved from [facility]   Enjoys children's sports   Enjoys outside activities   dog   Complete bachelors degree      Right handed   Social Determinants of Health   Financial Resource Strain: Not on file  Food Insecurity: Not on file  Transportation Needs: Not on file  Physical Activity: Not on file  Stress: Not on file  Social Connections: Not on file  Intimate Partner Violence: Not on file    Outpatient Medications Prior to Visit  Medication Sig Dispense Refill   cholecalciferol (VITAMIN D3) 25 MCG (1000 UNIT) tablet Take 1,000 Units by mouth daily.     fluticasone (FLONASE) 50 MCG/ACT nasal spray Place 2 sprays into both nostrils daily. 1 g 0   levalbuterol (XOPENEX HFA) 45 MCG/ACT inhaler Inhale 2 puffs into the lungs every 4 (four) hours as needed for wheezing. 1 each 3   Multiple Vitamins-Minerals (MULTIVITAMIN WITH MINERALS) tablet Take 1 tablet by mouth daily.     Potassium Chloride ER 20 MEQ TBCR Take  by mouth. (Patient not taking: Reported on 06/11/2021)     azithromycin (ZITHROMAX) 250 MG tablet Take by mouth daily.     guaiFENesin (MUCINEX) 600 MG 12 hr tablet Take 2 tablets (1,200 mg total) by mouth 2 (two) times daily. 30 tablet 2   HYDROCODONE-HOMATROPINE PO Take by mouth.     zinc gluconate 50 MG tablet Take 50 mg by mouth daily.     No facility-administered medications prior to visit.    No Known Allergies  ROS See HPI    Objective:    Physical Exam Constitutional:      General: She is not in acute distress.    Appearance: Normal appearance. She is well-developed.  HENT:     Head: Normocephalic and atraumatic.     Right Ear: External ear normal.     Left Ear: External ear normal.  Eyes:     General: No scleral icterus. Neck:     Thyroid: No thyromegaly.  Cardiovascular:     Rate and Rhythm: Normal rate and regular rhythm.     Heart sounds: Normal heart sounds. No murmur heard. Pulmonary:     Effort: Pulmonary effort is normal. No respiratory distress.     Breath sounds: Normal breath sounds. No wheezing.  Musculoskeletal:     Cervical back: Neck supple.  Skin:    General: Skin is warm and dry.  Neurological:     Mental Status: She is alert and oriented to person, place, and time.  Psychiatric:        Mood and Affect: Mood normal.        Behavior: Behavior normal.        Thought Content: Thought content normal.        Judgment: Judgment normal.    BP 122/66 (BP Location: Right Arm, Patient Position: Sitting, Cuff Size: Large)    Pulse 99    Temp 98.4 F (36.9 C) (Oral)    Resp 16    Wt (!) 308 lb (139.7 kg)    LMP 05/29/2021    SpO2 100%    BMI 53.70 kg/m  Wt Readings from Last 3 Encounters:  06/11/21 (!) 308 lb (139.7 kg)  06/05/21 (!) 308 lb (139.7 kg)  05/27/21 (!) 310 lb 9.6 oz (140.9 kg)       Assessment & Plan:   Problem List Items Addressed This Visit       Unprioritized  Pneumonia of left lower lobe due to infectious organism - Primary     Now with SOB, worsening CXR and pleural effusion. I spoke with the patient. Since she has failed outpatient treatment, I think she needs eval to include CT scan and possible thoracentesis as well as IV abx.  Pt verbalizes understanding and plans to go to the [facility] near her home tonight.       Relevant Orders   DG Chest 2 View (Completed)   30 minutes were spent on today's visit. Time was spent reviewing outside records, counseling patient and formulating medical plan.   I have discontinued Dae Belfiore's zinc gluconate, guaiFENesin, azithromycin, and HYDROCODONE-HOMATROPINE PO. I am also having her maintain her cholecalciferol, fluticasone, Potassium Chloride ER, multivitamin with minerals, and levalbuterol.  No orders of the defined types were placed in this encounter.

## 2021-06-12 ENCOUNTER — Telehealth: Payer: Self-pay | Admitting: Family

## 2021-06-12 DIAGNOSIS — J90 Pleural effusion, not elsewhere classified: Secondary | ICD-10-CM | POA: Diagnosis not present

## 2021-06-12 DIAGNOSIS — Z87891 Personal history of nicotine dependence: Secondary | ICD-10-CM | POA: Diagnosis not present

## 2021-06-12 DIAGNOSIS — E876 Hypokalemia: Secondary | ICD-10-CM | POA: Diagnosis not present

## 2021-06-12 DIAGNOSIS — G35 Multiple sclerosis: Secondary | ICD-10-CM | POA: Diagnosis not present

## 2021-06-12 DIAGNOSIS — Z8701 Personal history of pneumonia (recurrent): Secondary | ICD-10-CM | POA: Diagnosis not present

## 2021-06-12 DIAGNOSIS — Z743 Need for continuous supervision: Secondary | ICD-10-CM | POA: Diagnosis not present

## 2021-06-12 DIAGNOSIS — Z6841 Body Mass Index (BMI) 40.0 and over, adult: Secondary | ICD-10-CM | POA: Diagnosis not present

## 2021-06-12 DIAGNOSIS — J189 Pneumonia, unspecified organism: Secondary | ICD-10-CM | POA: Diagnosis not present

## 2021-06-12 DIAGNOSIS — Z85038 Personal history of other malignant neoplasm of large intestine: Secondary | ICD-10-CM | POA: Diagnosis not present

## 2021-06-12 DIAGNOSIS — Z853 Personal history of malignant neoplasm of breast: Secondary | ICD-10-CM | POA: Diagnosis not present

## 2021-06-12 DIAGNOSIS — R0602 Shortness of breath: Secondary | ICD-10-CM | POA: Diagnosis not present

## 2021-06-12 DIAGNOSIS — R000 Tachycardia, unspecified: Secondary | ICD-10-CM | POA: Diagnosis not present

## 2021-06-12 DIAGNOSIS — J181 Lobar pneumonia, unspecified organism: Secondary | ICD-10-CM | POA: Diagnosis not present

## 2021-06-12 NOTE — Telephone Encounter (Signed)
See mychart.  

## 2021-06-13 ENCOUNTER — Telehealth: Payer: Self-pay | Admitting: Family

## 2021-06-13 NOTE — Telephone Encounter (Signed)
Please contact pt to schedule a hospital follow up with me next week.

## 2021-06-14 NOTE — Telephone Encounter (Signed)
Patient was scheduled to come in 06-21-21

## 2021-06-20 NOTE — Telephone Encounter (Signed)
New inhaler rx sent by provider

## 2021-06-21 ENCOUNTER — Ambulatory Visit (HOSPITAL_BASED_OUTPATIENT_CLINIC_OR_DEPARTMENT_OTHER)
Admission: RE | Admit: 2021-06-21 | Discharge: 2021-06-21 | Disposition: A | Discharge status: Home / Self Care | Payer: BC Managed Care – PPO | Source: Ambulatory Visit | Attending: Family | Admitting: Family

## 2021-06-21 ENCOUNTER — Other Ambulatory Visit: Payer: Self-pay

## 2021-06-21 ENCOUNTER — Ambulatory Visit: Payer: BC Managed Care – PPO | Admitting: Family

## 2021-06-21 VITALS — BP 108/65 | HR 77 | Temp 97.9°F | Resp 16 | Wt 307.0 lb

## 2021-06-21 DIAGNOSIS — J90 Pleural effusion, not elsewhere classified: Secondary | ICD-10-CM | POA: Insufficient documentation

## 2021-06-21 DIAGNOSIS — E8809 Other disorders of plasma-protein metabolism, not elsewhere classified: Secondary | ICD-10-CM | POA: Diagnosis not present

## 2021-06-21 DIAGNOSIS — D649 Anemia, unspecified: Secondary | ICD-10-CM | POA: Diagnosis not present

## 2021-06-21 HISTORY — DX: Pleural effusion, not elsewhere classified: J90

## 2021-06-21 IMAGING — DX DG CHEST 2V
2 series · 2 of 2 positions shown · non-contrast
Comparison: [DATE]

CLINICAL DATA: Pleural effusion

EXAM:
CHEST - 2 VIEW

[chest pa]
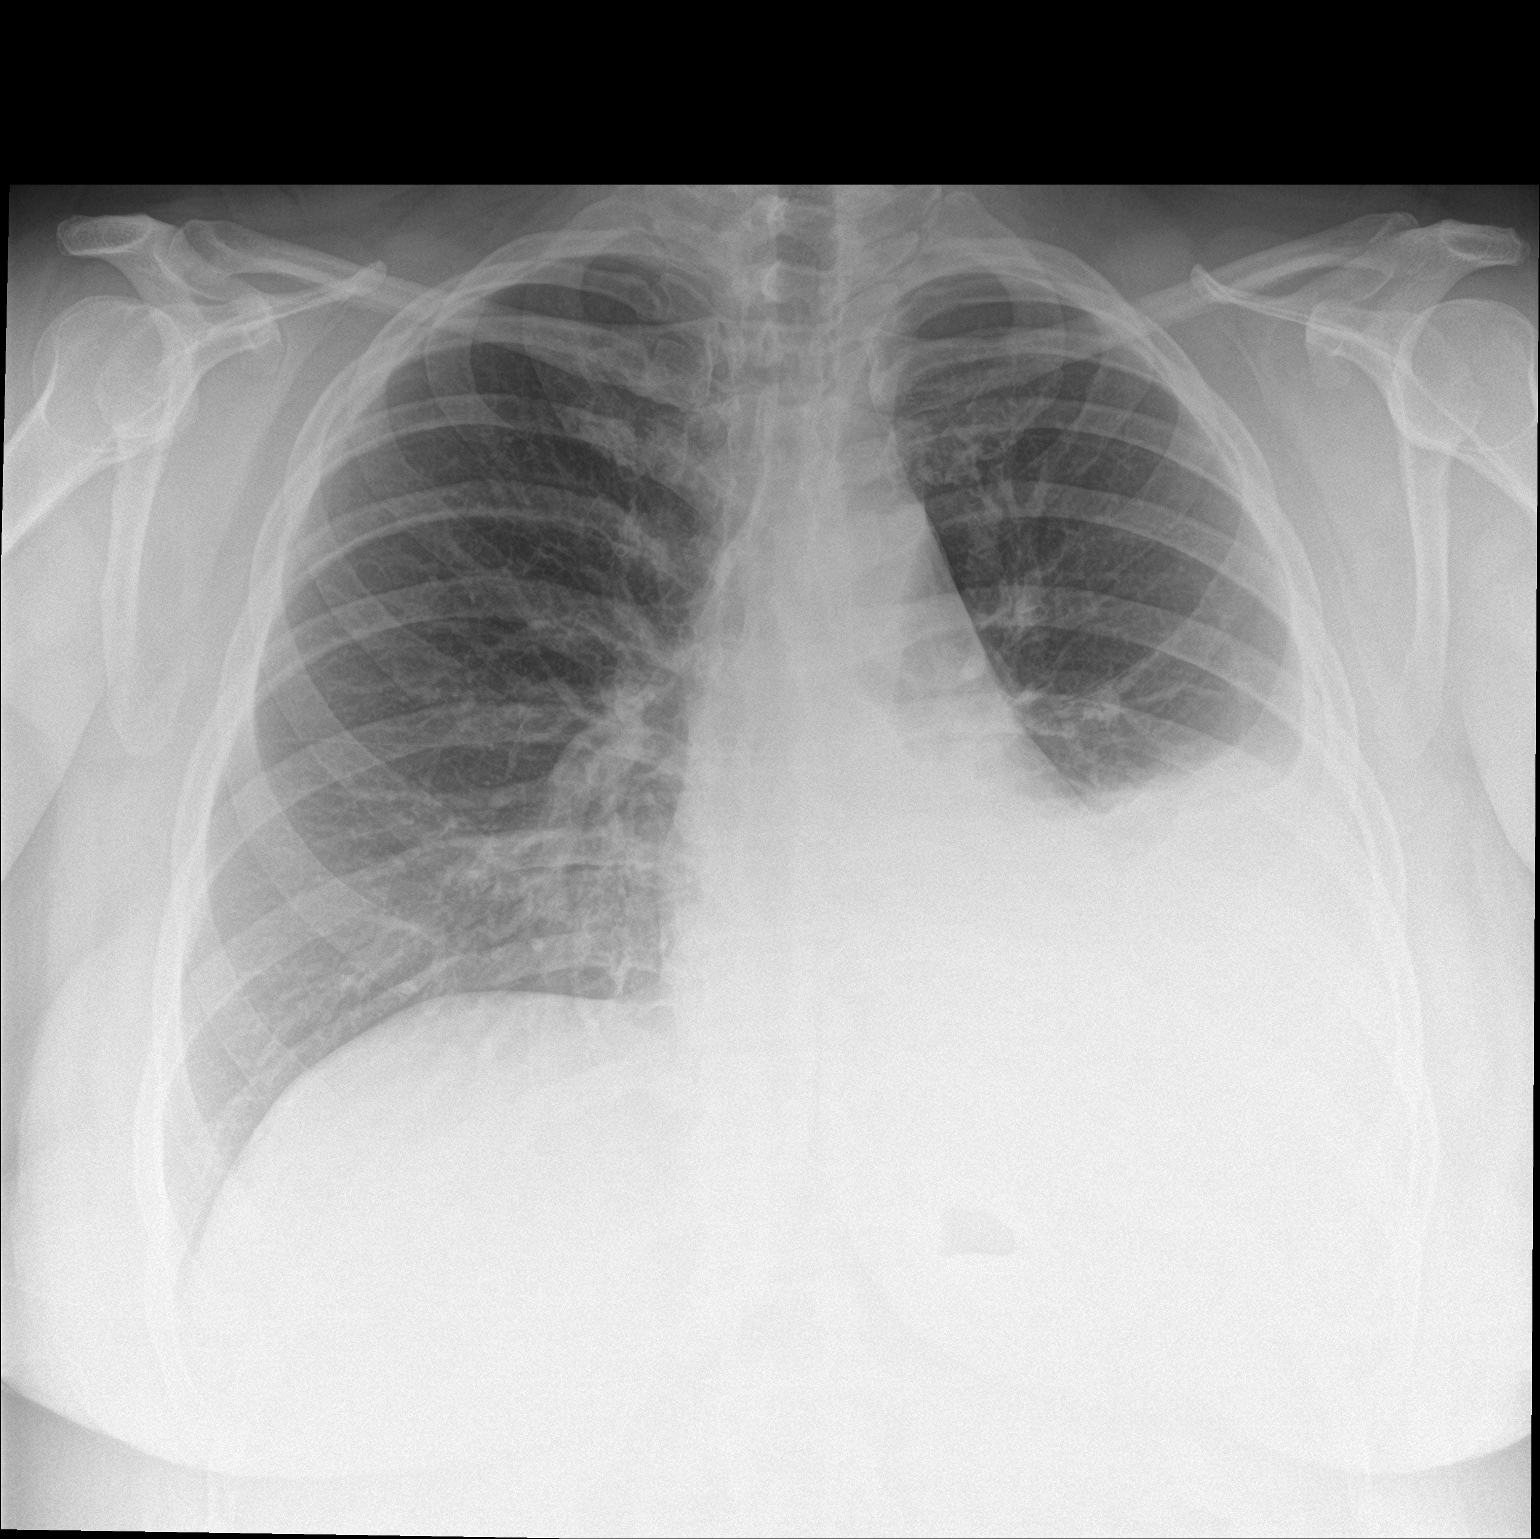

[chest lat]
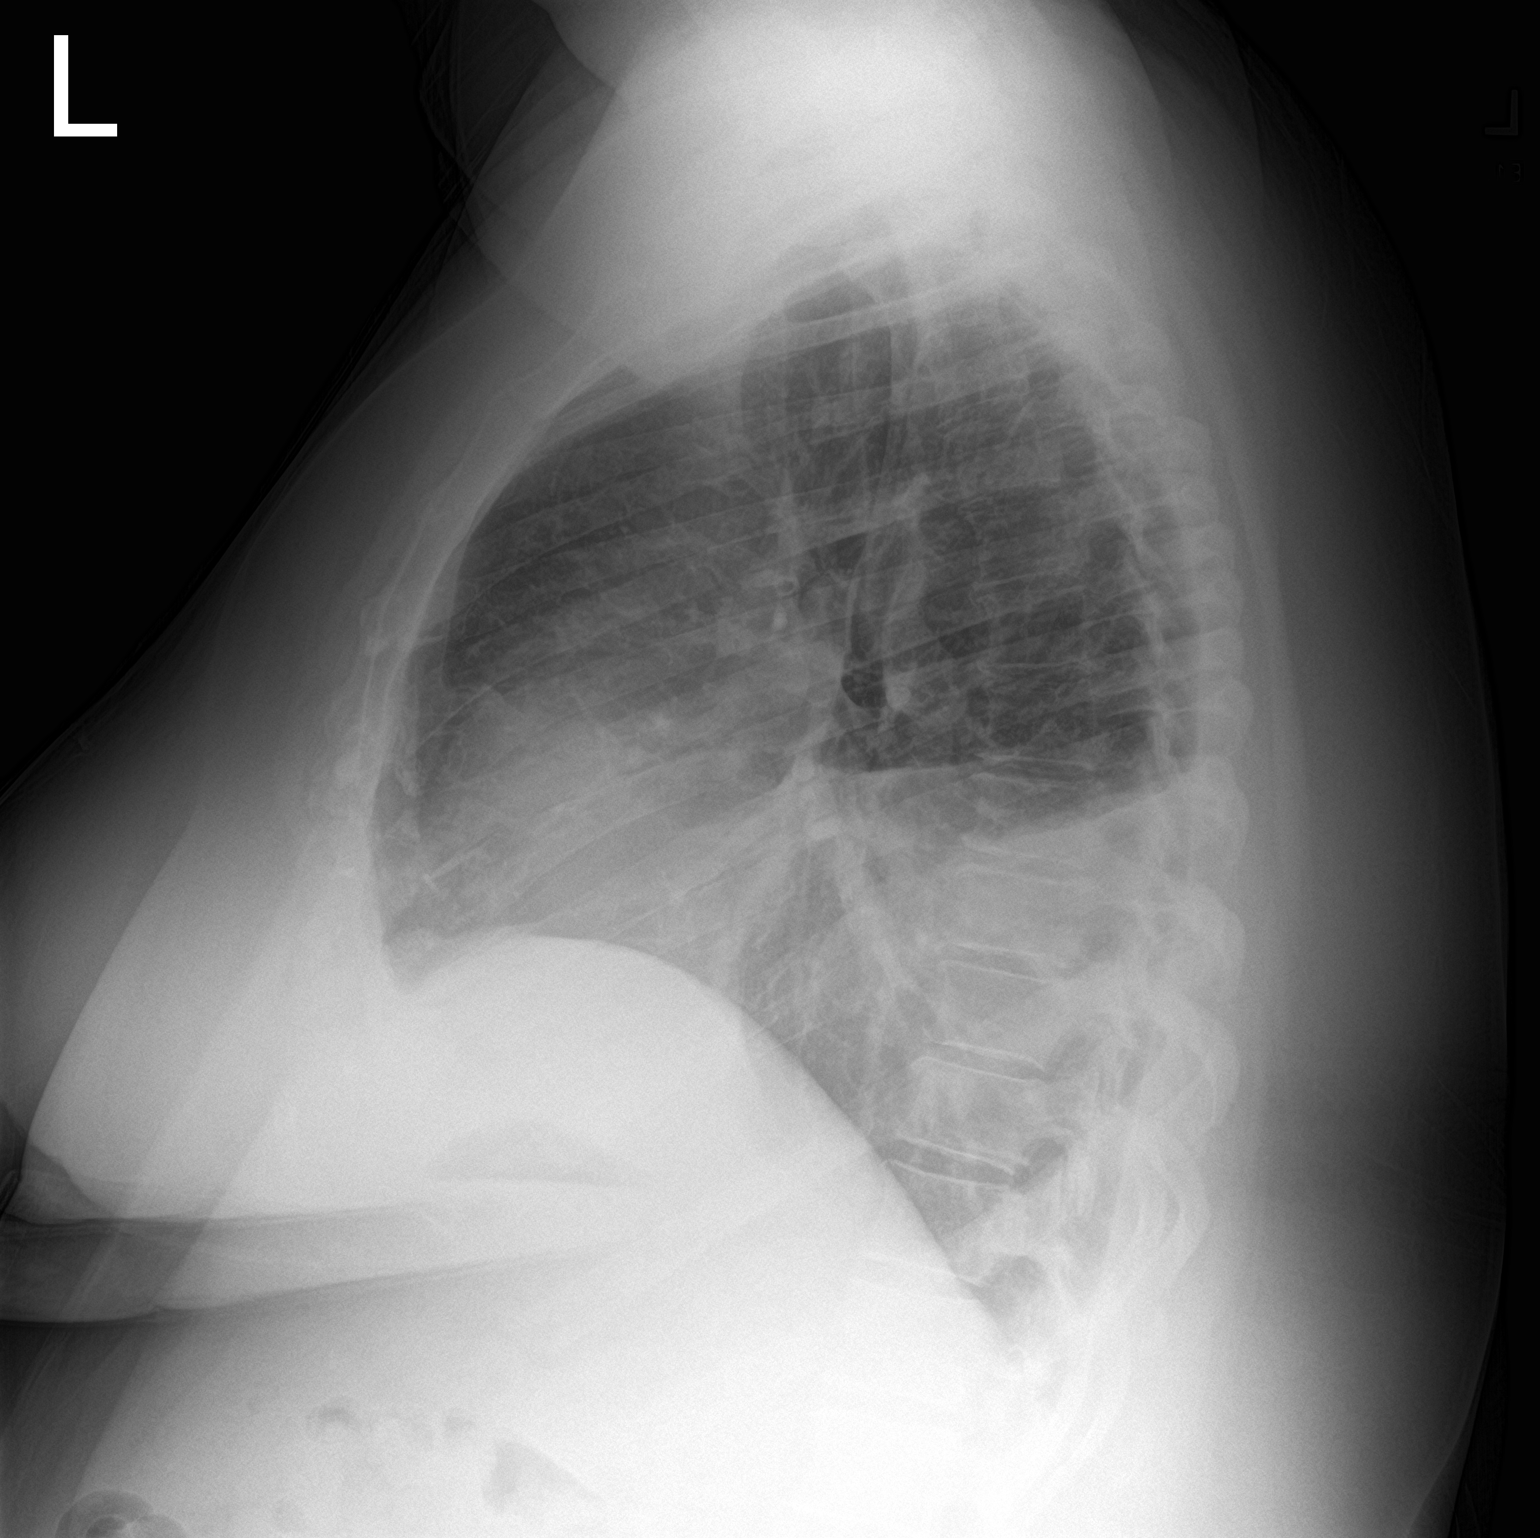

[2 of 2 positions shown; findings below may reference images not displayed]

FINDINGS: Normal heart size. Moderate left-sided pleural effusion, increased
from prior. Associated left basilar opacity. Right lung is clear. No
pneumothorax.
IMPRESSION: Moderate left-sided pleural effusion, increased from prior.

## 2021-06-21 NOTE — Assessment & Plan Note (Signed)
Lab Results  Component Value Date   WBC 7.2 06/05/2021   HGB 11.8 (L) 06/05/2021   HCT 36.0 06/05/2021   MCV 87.0 06/05/2021   PLT 318.0 06/05/2021   Will obtain labs as below. She does report that her periods are sometimes heavy.

## 2021-06-21 NOTE — Patient Instructions (Signed)
Please complete lab work prior to leaving. Complete x-ray prior to leaving.

## 2021-06-21 NOTE — Progress Notes (Signed)
Subjective:     Patient ID: Elizabeth Dixon, female    DOB: 15-Feb-1976, 46 y.o.   MRN: [id number]  Chief Complaint  Patient presents with   [facility] follow up    HPI  Patient is in today for hospital follow up. Pt was seen on 2/14 and noted to have L pleural effusion/SOB. She was advised to go to the ED and was subsequently admitted.  She was treated with IV cefepime and azithromycin.  She had thoracentesis (50 cc drained). Cytology noted no malignant cells identified. Blood culture negative x 2. Pleural fluid culture negative.   Reports that she is feeling a lot better following thoracentesis.  She is still coughing with exertion and having DOE but overall feels improved.  She does not have pulmonary follow up.  No fevers.    Health Maintenance Due  Topic Date Due   URINE MICROALBUMIN  Never done   COVID-19 Vaccine (2 - Janssen risk series) 07/29/2019   COLONOSCOPY (Pts 45-11yr Insurance coverage will need to be confirmed)  04/23/2021    Past Medical History:  Diagnosis Date   Anemia    [place]   left breast- ductal carcinoma in situ   Family history of breast cancer    Family history of prostate cancer    Hyperlipidemia    hx of   Iron deficiency anemia    hx of   [place] sclerosis [place]    dx 2002   [facility]     Past Surgical History:  Procedure Laterality Date   BREAST LUMPECTOMY Left 08/2014   BREAST REDUCTION SURGERY Bilateral 08/2014   CESAREAN SECTION  2013   FLEXIBLE SIGMOIDOSCOPY N/A 05/31/2020   Procedure: FLEXIBLE SIGMOIDOSCOPY, ICG INTRAOP ASSESSMENT OF PERFUSION;  Surgeon: WIleana Roup MD;  Location: [facility];  Service: General;  Laterality: N/A;   SIGMOIDECTOMY  05/31/2020   XI ROBOT ASSISTED SIGMOID COLECTOMY WITH COLORECTAL ANASTOMOSIS   VAGINA SURGERY     vagina tear from childbirth   WISDOM TOOTH EXTRACTION      Family History  Problem Relation Age of Onset   Thyroid disease Mother     Hypertension Father    Prostate cancer Father 550  Skin cancer Father 760  Parkinson's disease Paternal Grandmother    Breast cancer Other        unconfirmed diagnosis, great-aunt (MGF's sister)   Colon cancer Neg Hx    Colon polyps Neg Hx    Esophageal cancer Neg Hx    Stomach cancer Neg Hx    Rectal cancer Neg Hx     Social History   Socioeconomic History   Marital status: Married    Spouse name: Not on file   Number of children: Not on file   Years of education: Not on file   Highest education level: Not on file  Occupational History   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  Tobacco Use   Smoking status: Former    Types: Cigarettes    Quit date: 2006    Years since quitting: 17.1   Smokeless tobacco: Never  Vaping Use   Vaping Use: Never used  Substance and Sexual Activity   Alcohol use: Yes    Alcohol/week: 1.0 standard drink    Types: 1 Standard drinks or equivalent per week    Comment: once a wk   Drug use: Never   Sexual activity: Yes    Partners: Male  Other Topics Concern  Not on file  Social History Narrative   Works as an Geologist, engineering   Married-  [place] 2013 son and 2011 daughter   Moved from [place]   Enjoys children's sports   Enjoys outside activities   dog   Complete bachelors degree      Right handed   Social Determinants of Health   Financial Resource Strain: Not on file  Food Insecurity: Not on file  Transportation Needs: Not on file  Physical Activity: Not on file  Stress: Not on file  Social Connections: Not on file  Intimate Partner Violence: Not on file    Outpatient Medications Prior to Visit  Medication Sig Dispense Refill   albuterol (VENTOLIN HFA) 108 (90 Base) MCG/ACT inhaler Use one puff as needed for shortness of breath     cholecalciferol (VITAMIN D3) 25 MCG (1000 UNIT) tablet Take 1,000 Units by mouth daily.     fluticasone (FLONASE) 50 MCG/ACT nasal spray Place 2 sprays into both nostrils daily. 1 g 0   Multiple Vitamins-Minerals  (MULTIVITAMIN WITH MINERALS) tablet Take 1 tablet by mouth daily.     levalbuterol (XOPENEX HFA) 45 MCG/ACT inhaler Inhale 2 puffs into the lungs every 4 (four) hours as needed for wheezing. 1 each 3   Potassium Chloride ER 20 MEQ TBCR Take by mouth. (Patient not taking: Reported on 06/11/2021)     No facility-administered medications prior to visit.    No Known Allergies  ROS    See HPI Objective:    Physical Exam Constitutional:      General: She is not in acute distress.    Appearance: Normal appearance. She is well-developed.  HENT:     Head: Normocephalic and atraumatic.     Right Ear: External ear normal.     Left Ear: External ear normal.  Eyes:     General: No scleral icterus. Neck:     Thyroid: No thyromegaly.  Cardiovascular:     Rate and Rhythm: Normal rate and regular rhythm.     Heart sounds: Normal heart sounds. No murmur heard. Pulmonary:     Effort: Pulmonary effort is normal. No respiratory distress.     Breath sounds: Examination of the left-lower field reveals decreased breath sounds. Decreased breath sounds (mild egophany left base) present. No wheezing.  Musculoskeletal:     Cervical back: Neck supple.  Skin:    General: Skin is warm and dry.  Neurological:     Mental Status: She is alert and oriented to person, place, and time.  Psychiatric:        Mood and Affect: Mood normal.        Behavior: Behavior normal.        Thought Content: Thought content normal.        Judgment: Judgment normal.    BP 108/65 (BP Location: Right Arm, Patient Position: Sitting, Cuff Size: Large)    Pulse 77    Temp 97.9 F (36.6 C) (Oral)    Resp 16    Wt (!) 307 lb (139.3 kg)    LMP 05/29/2021    SpO2 100%    BMI 53.53 kg/m  Wt Readings from Last 3 Encounters:  06/21/21 (!) 307 lb (139.3 kg)  06/11/21 (!) 308 lb (139.7 kg)  06/05/21 (!) 308 lb (139.7 kg)       Assessment & Plan:   Problem List Items Addressed This Visit       Unprioritized   Pleural  effusion - Primary  S/p thoracentesis. She has improved clinically but has not yet returned to baseline.  Will obtain CXR today. Consider outpatient pulmonary consult pending review of these results.       Relevant Orders   DG Chest 2 View   Hypoalbuminemia    Etiology unclear. Will obtain UA to evaluate for proteinuria.       Relevant Orders   Comp Met (CMET)   Urinalysis, Routine w reflex microscopic   Anemia    Lab Results  Component Value Date   WBC 7.2 06/05/2021   HGB 11.8 (L) 06/05/2021   HCT 36.0 06/05/2021   MCV 87.0 06/05/2021   PLT 318.0 06/05/2021  Will obtain labs as below. She does report that her periods are sometimes heavy.       Relevant Orders   B12   Folate   Iron   Ferritin    I have discontinued Loy Meckley's Potassium Chloride ER and levalbuterol. I am also having her maintain her cholecalciferol, fluticasone, multivitamin with minerals, and albuterol.  No orders of the defined types were placed in this encounter.

## 2021-06-21 NOTE — Assessment & Plan Note (Signed)
Etiology unclear. Will obtain UA to evaluate for proteinuria.

## 2021-06-21 NOTE — Assessment & Plan Note (Signed)
S/p thoracentesis. She has improved clinically but has not yet returned to baseline.  Will obtain CXR today. Consider outpatient pulmonary consult pending review of these results.

## 2021-06-23 ENCOUNTER — Telehealth: Payer: Self-pay | Admitting: Family

## 2021-06-23 ENCOUNTER — Telehealth: Payer: Self-pay | Admitting: Pulmonary Disease

## 2021-06-23 DIAGNOSIS — J90 Pleural effusion, not elsewhere classified: Secondary | ICD-10-CM

## 2021-06-23 NOTE — Telephone Encounter (Signed)
Late entry. Reviewed CXR results with patient yesterday. Advised pt that we will work on pulmonary referral early this week, but in the meantime, if she develops increased SOB to return to the ER. Case reviewed with Dr. Elsworth Soho. Will obtain CT chest and pulmonary team will work on getting her in for [place].

## 2021-06-23 NOTE — Telephone Encounter (Signed)
Dr. Conley Canal requesting early consult  for this patient Triage, please obtain with any provider ASAP  Left parapneumonic effusion, thoracentesis only yielded 50 cc

## 2021-06-24 NOTE — Telephone Encounter (Signed)
Called and spoke with patient. I was able to get her scheduled with Dr. Loanne Drilling for 07/05/21 at 2pm. I have provided her with our office address and phone number.   While on the phone, she stated that someone had left a message on her phone about her needing a CT scan.  Dr. Elsworth Soho, did you want her to have a CT scan before her consult?

## 2021-06-24 NOTE — Telephone Encounter (Signed)
Called and spoke with patient. She is aware that the CT was ordered by her PCP. I checked the schedules again and 3/10 is the soonest I could get her scheduled for now.   Nothing further needed at time of call.

## 2021-06-24 NOTE — Telephone Encounter (Signed)
Also, Dr. Elsworth Soho is requesting that pt complete a CBC. Can you please schedule cbc with diff. Dx pleural effusion?

## 2021-06-25 ENCOUNTER — Other Ambulatory Visit (INDEPENDENT_AMBULATORY_CARE_PROVIDER_SITE_OTHER): Payer: BC Managed Care – PPO

## 2021-06-25 DIAGNOSIS — E8809 Other disorders of plasma-protein metabolism, not elsewhere classified: Secondary | ICD-10-CM

## 2021-06-25 DIAGNOSIS — D649 Anemia, unspecified: Secondary | ICD-10-CM

## 2021-06-25 NOTE — Addendum Note (Signed)
Addended by: Manuela Schwartz on: 06/25/2021 04:31 PM   Modules accepted: Orders

## 2021-06-26 ENCOUNTER — Ambulatory Visit (HOSPITAL_BASED_OUTPATIENT_CLINIC_OR_DEPARTMENT_OTHER)
Admission: RE | Admit: 2021-06-26 | Discharge: 2021-06-26 | Disposition: A | Discharge status: Home / Self Care | Payer: BC Managed Care – PPO | Source: Ambulatory Visit | Attending: Family | Admitting: Family

## 2021-06-26 ENCOUNTER — Other Ambulatory Visit: Payer: Self-pay

## 2021-06-26 DIAGNOSIS — J189 Pneumonia, unspecified organism: Secondary | ICD-10-CM | POA: Diagnosis not present

## 2021-06-26 DIAGNOSIS — J90 Pleural effusion, not elsewhere classified: Secondary | ICD-10-CM | POA: Insufficient documentation

## 2021-06-26 LAB — URINALYSIS, ROUTINE W REFLEX MICROSCOPIC
Bilirubin Urine: NEGATIVE
Ketones, ur: NEGATIVE
Leukocytes,Ua: NEGATIVE
Nitrite: NEGATIVE
Specific Gravity, Urine: 1.025 (ref 1.000–1.030)
Total Protein, Urine: NEGATIVE
Urine Glucose: NEGATIVE
Urobilinogen, UA: 0.2 (ref 0.0–1.0)
pH: 6 (ref 5.0–8.0)

## 2021-06-26 LAB — COMPREHENSIVE METABOLIC PANEL
ALT: 12 U/L (ref 0–35)
AST: 16 U/L (ref 0–37)
Albumin: 3.5 g/dL (ref 3.5–5.2)
Alkaline Phosphatase: 81 U/L (ref 39–117)
BUN: 12 mg/dL (ref 6–23)
CO2: 29 mEq/L (ref 19–32)
Calcium: 9.1 mg/dL (ref 8.4–10.5)
Chloride: 99 mEq/L (ref 96–112)
Creatinine, Ser: 0.74 mg/dL (ref 0.40–1.20)
GFR: 96.5 mL/min (ref >60.00)
Glucose, Bld: 85 mg/dL (ref 70–99)
Potassium: 4 mEq/L (ref 3.5–5.1)
Sodium: 137 mEq/L (ref 135–145)
Total Bilirubin: 0.3 mg/dL (ref 0.2–1.2)
Total Protein: 7.3 g/dL (ref 6.0–8.3)

## 2021-06-26 LAB — VITAMIN B12: Vitamin B-12: 358 pg/mL (ref 211–911)

## 2021-06-26 LAB — IRON: Iron: 35 ug/dL — ABNORMAL LOW (ref 42–145)

## 2021-06-26 LAB — FERRITIN: Ferritin: 64.4 ng/mL (ref 10.0–291.0)

## 2021-06-26 LAB — FOLATE: Folate: 18.6 ng/mL (ref >5.9)

## 2021-06-26 IMAGING — CT CT CHEST W/O CM
2 of 3 series · 15 of 36 positions shown, 18 images · non-contrast
Comparison: Chest radiograph dated [DATE] and [DATE] and CT
dated [DATE].

CLINICAL DATA: Pneumonia.



[Series 2: thorax · axial · 0.89mm/px · z∈[-336,-46]mm · 12 of 171 slices shown, 15 images]
[im 13/171  mediastinal]
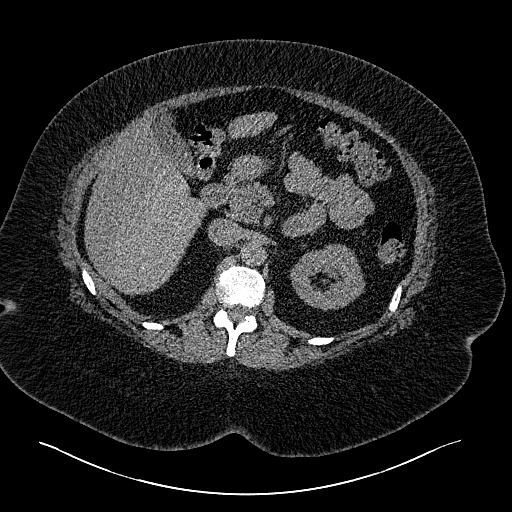
[im 13/171  lung]
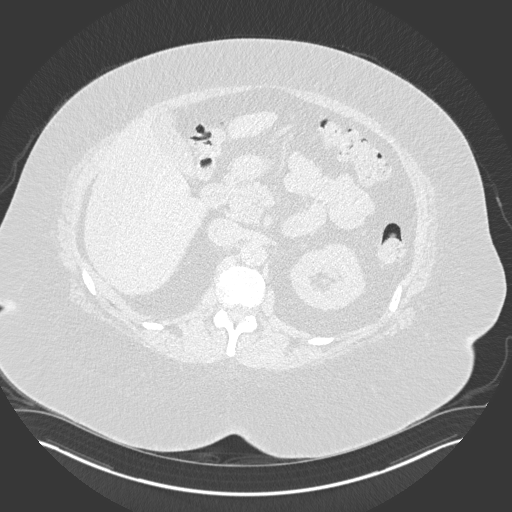
[im 26/171  lung]
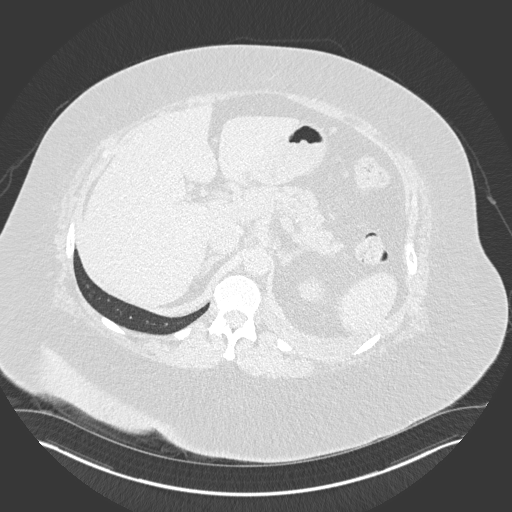
[im 38/171  lung]
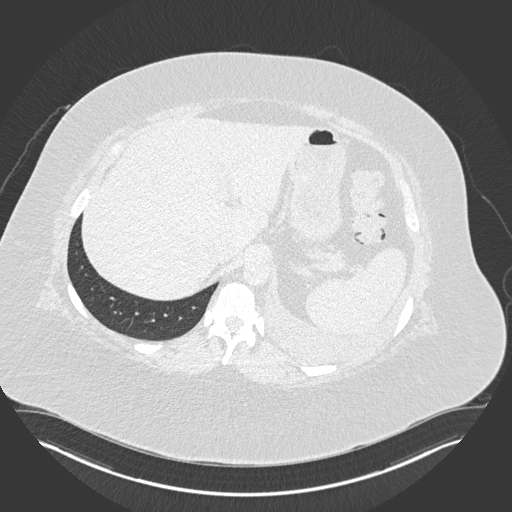
[im 51/171  lung]
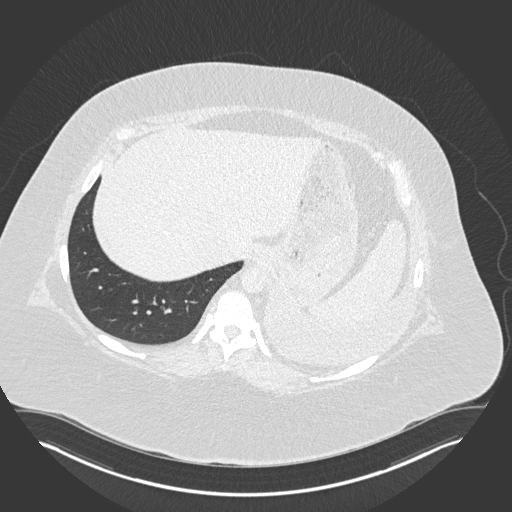
[im 63/171  mediastinal]
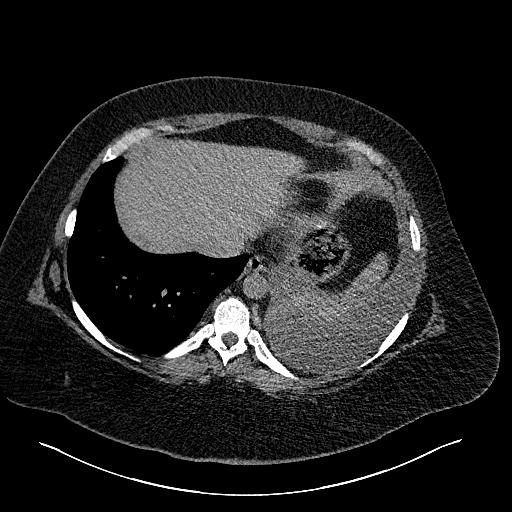
[im 63/171  lung]
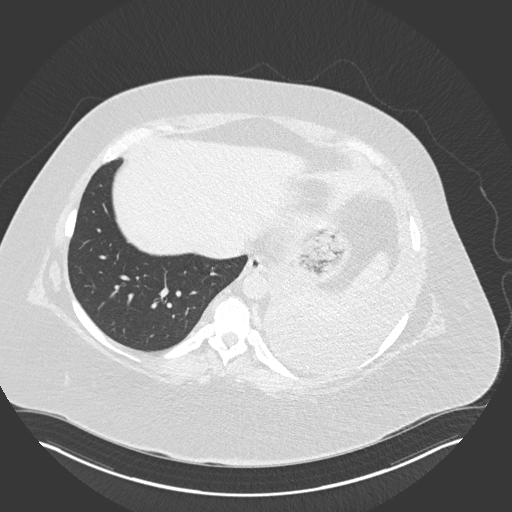
[im 76/171  lung]
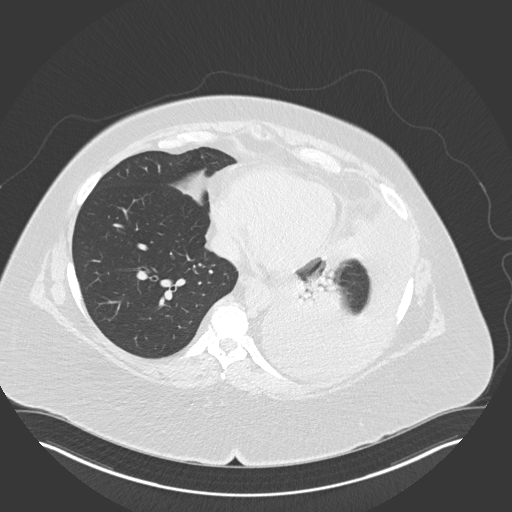
[im 95/171  lung]
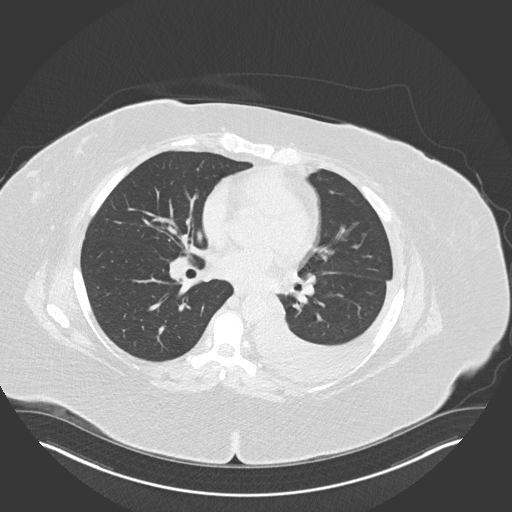
[im 108/171  lung]
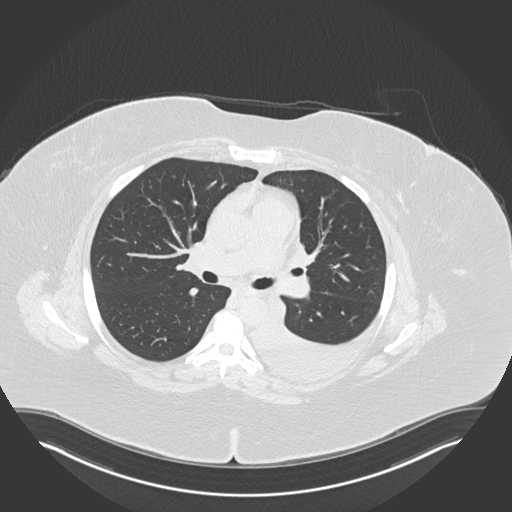
[im 120/171  mediastinal]
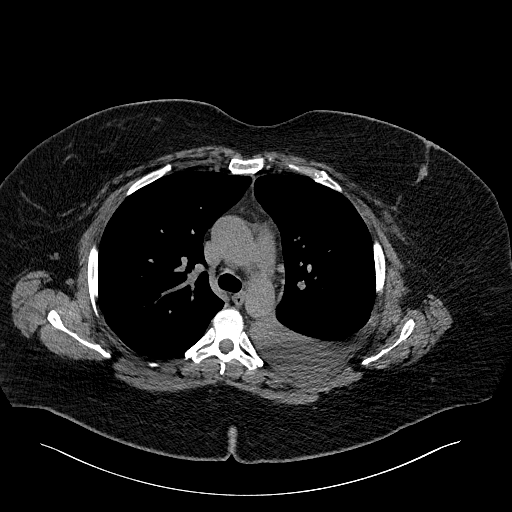
[im 120/171  lung]
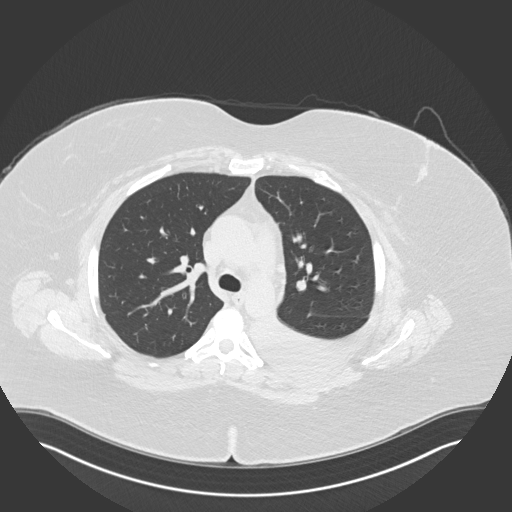
[im 133/171  lung]
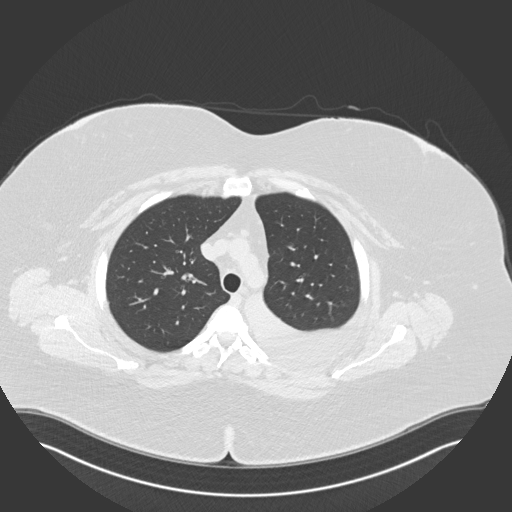
[im 145/171  lung]
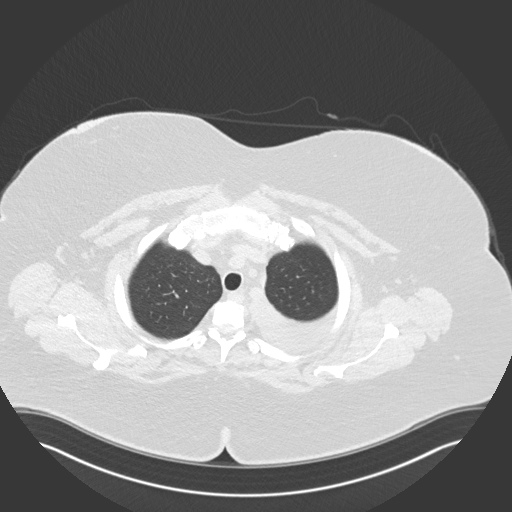
[im 158/171  lung]
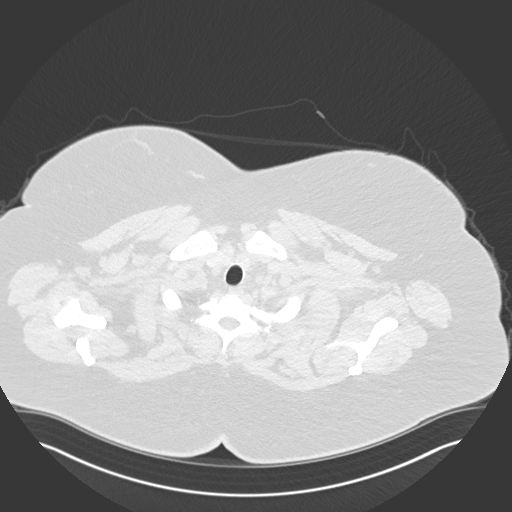

[Series 5: coronal · coronal · 0.67mm/px · 3 of 175 slices shown]
[im 35/175  lung]
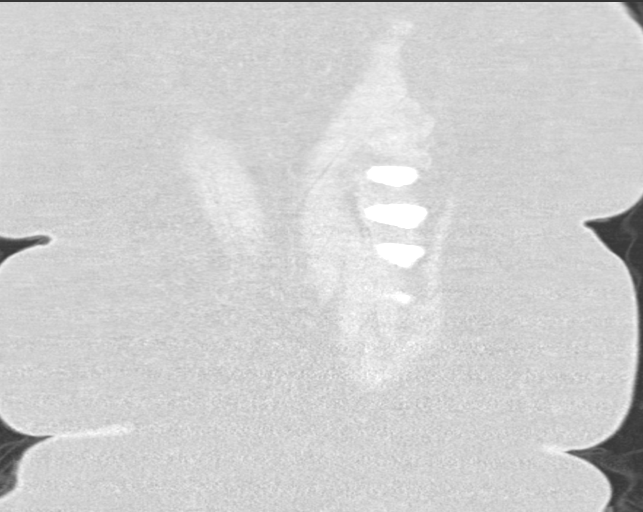
[im 70/175  lung]
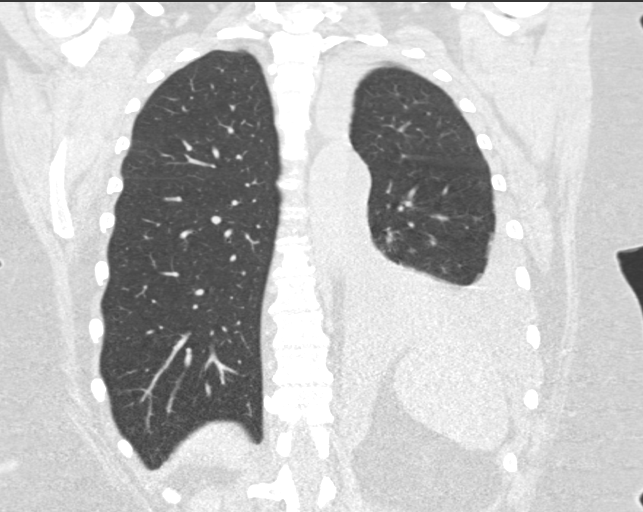
[im 105/175  lung]
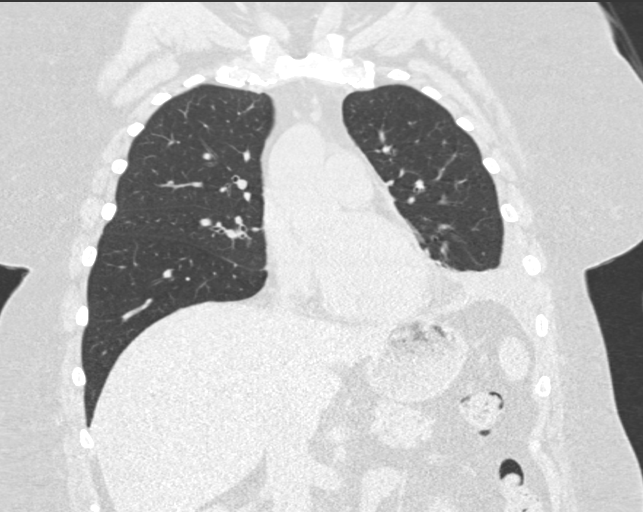

[15 of 36 positions shown; findings below may reference images not displayed]

FINDINGS: Evaluation of this exam is limited in the absence of intravenous
contrast.

Cardiovascular: There is no cardiomegaly or pericardial effusion.
The thoracic aorta and central pulmonary arteries are grossly
unremarkable.

Mediastinum/Nodes: No hilar or mediastinal adenopathy. The esophagus
and the thyroid gland are grossly unremarkable. No mediastinal fluid
collection.

Lungs/Pleura: Moderate left pleural effusion with partial
compressive atelectasis of the left lower lobe and lingula versus
pneumonia. Clinical correlation and follow-up to resolution
recommended. The right lung is clear. No pneumothorax. The central
airways are patent.

Upper Abdomen: Fatty liver.  Multiple gallstones.

Musculoskeletal: No chest wall mass or suspicious bone lesions
identified.
IMPRESSION: 1. Moderate left pleural effusion with partial compressive
atelectasis of the left lower lobe and lingula versus pneumonia.
Clinical correlation and follow-up to resolution recommended.
2. Fatty liver.
3. Cholelithiasis.

## 2021-06-26 NOTE — Progress Notes (Signed)
? ?Synopsis: Referred for pleural effusion by [name], NP ? ?Subjective:  ? ?PATIENT ID: Elizabeth Dixon GENDER: female DOB: 03/02/1976, MRN: [id number] ? ?Chief Complaint  ?Patient presents with  ? Pulmonary Consult  ?  Referred by [name] for eval of pleural effusion. Pt states had PNA in beginning of Feb 2022. She c/o SOB when she lies down. She has to sleep on her right side.She has occ cough when she exerts herself- non prod at this time.   ? ?45yF with history of L breast DCIS, MS referred for pleural effusion ? ?Went to ED 2/14 admitted at [facility], underwent left thora with only 50cc drained, given IV ABX. She originally did have productive cough, hemoptysis, then some dyspnea and dry cough after completing course of ABX. Feels overall pretty close to back to normal. Can feel some chest tightness and dyspnea when she lies down on her left side.  ? ?Had CT Chest yesterday with moderate left effusion. ? ?Otherwise pertinent review of systems is negative. ? ?No family history of lung cancer. Dad with prostate, skin cancer ? 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 ? ?Past Medical History:  ?Diagnosis Date  ? Anemia   ? Breast cancer ([place]) 2016  ? left breast- ductal carcinoma in situ  ? Family history of breast cancer   ? Family history of prostate cancer   ? Hyperlipidemia   ? hx of  ? Iron deficiency anemia   ? hx of  ? Multiple sclerosis ([place])   ? dx 2002  ? Neuromuscular disorder ([place])   ?  ? ?Family History  ?Problem Relation Age of Onset  ? Thyroid disease Mother   ? Hypertension Father   ? Prostate cancer Father 87  ? Skin cancer Father 68  ? Parkinson's disease Paternal Grandmother   ? Breast cancer Other   ?     unconfirmed diagnosis, great-aunt (MGF's sister)  ? Colon cancer Neg Hx   ? Colon polyps Neg Hx   ? Esophageal cancer Neg Hx   ? Stomach cancer Neg Hx   ? Rectal cancer Neg Hx   ?  ? ?Past Surgical History:  ?Procedure Laterality Date  ? BREAST  LUMPECTOMY Left 08/2014  ? BREAST REDUCTION SURGERY Bilateral 08/2014  ? CESAREAN SECTION  2013  ? FLEXIBLE SIGMOIDOSCOPY N/A 05/31/2020  ? Procedure: FLEXIBLE SIGMOIDOSCOPY, ICG INTRAOP ASSESSMENT OF PERFUSION;  Surgeon: Ileana Roup, MD;  Location: [facility];  Service: General;  Laterality: N/A;  ? SIGMOIDECTOMY  05/31/2020  ? XI ROBOT ASSISTED SIGMOID COLECTOMY WITH COLORECTAL ANASTOMOSIS  ? VAGINA SURGERY    ? vagina tear from childbirth  ? WISDOM TOOTH EXTRACTION    ? ? ?Social History  ? ?Socioeconomic History  ? Marital status: Married  ?  Spouse name: Not on file  ? Number of children: Not on file  ? Years of education: Not on file  ? Highest education level: Not on file  ?Occupational History  ? Occupation: [employer] features editor  ?Tobacco Use  ? Smoking status: Former  ?  Packs/day: 1.00  ?  Years: 5.00  ?  Pack years: 5.00  ?  Types: Cigarettes  ?  Quit date: 04/29/2003  ?  Years since quitting: 18.1  ? Smokeless tobacco: Never  ?Vaping Use  ? Vaping Use: Never used  ?Substance and Sexual Activity  ? Alcohol use: Yes  ?  Alcohol/week: 1.0 standard drink  ?  Types: 1 Standard drinks or equivalent per week  ?  Comment: once a wk  ? Drug use: Never  ? Sexual activity: Yes  ?  Partners: Male  ?Other Topics Concern  ? Not on file  ?Social History Narrative  ? Works as an Geologist, engineering  ? Married-  Children 2013 son and 2011 daughter  ? Moved from [facility]  ? Enjoys children's sports  ? Enjoys outside activities  ? dog  ? Complete bachelors degree  ?   ? Right handed  ? ?Social Determinants of Health  ? ?Financial Resource Strain: Not on file  ?Food Insecurity: Not on file  ?Transportation Needs: Not on file  ?Physical Activity: Not on file  ?Stress: Not on file  ?Social Connections: Not on file  ?Intimate Partner Violence: Not on file  ?  ? ?No Known Allergies  ? ?Outpatient Medications Prior to Visit  ?Medication Sig Dispense Refill  ? albuterol (VENTOLIN HFA) 108 (90 Base) MCG/ACT inhaler Use one puff as needed for shortness  of breath    ? cholecalciferol (VITAMIN D3) 25 MCG (1000 UNIT) tablet Take 1,000 Units by mouth daily.    ? fluticasone (FLONASE) 50 MCG/ACT nasal spray Place 2 sprays into both nostrils daily. (Patient taking differently: Place 2 sprays into both nostrils daily as needed.) 1 g 0  ? Multiple Vitamins-Minerals (MULTIVITAMIN WITH MINERALS) tablet Take 1 tablet by mouth daily.    ? ferrous sulfate 325 (65 FE) MG tablet Take 1 tablet (325 mg total) by mouth every other day.  3  ? ?No facility-administered medications prior to visit.  ? ? ? ? ? ?Objective:  ? ?Physical Exam: ? ?General appearance: 46 y.o., female, NAD, conversant  ?Eyes: anicteric sclerae; PERRL, tracking appropriately ?HENT: NCAT; MMM ?Neck: Trachea midline; no lymphadenopathy, no JVD ?Lungs: CTAB, no crackles, no wheeze, with normal respiratory effort ?CV: RRR, no murmur  ?Abdomen: Soft, non-tender; non-distended, BS present  ?Extremities: No peripheral edema, warm ?Skin: Normal turgor and texture; no rash ?Psych: Appropriate affect ?Neuro: Alert and oriented to person and place, no focal deficit  ? ? ? ?Vitals:  ? 06/27/21 1115  ?BP: 116/78  ?Pulse: 87  ?Temp: 98 ?F (36.7 ?C)  ?TempSrc: Oral  ?SpO2: 97%  ?Weight: (!) 303 lb 6.4 oz (137.6 kg)  ?Height: 5' 3.5" (1.613 m)  ? ?97% on RA ?BMI Readings from Last 3 Encounters:  ?06/27/21 52.90 kg/m?  ?06/21/21 53.53 kg/m?  ?06/11/21 53.70 kg/m?  ? ?Wt Readings from Last 3 Encounters:  ?06/27/21 (!) 303 lb 6.4 oz (137.6 kg)  ?06/21/21 (!) 307 lb (139.3 kg)  ?06/11/21 (!) 308 lb (139.7 kg)  ? ? ? ?CBC ?   ?Component Value Date/Time  ? WBC 7.2 06/05/2021 1019  ? RBC 4.14 06/05/2021 1019  ? HGB 11.8 (L) 06/05/2021 1019  ? HCT 36.0 06/05/2021 1019  ? PLT 318.0 06/05/2021 1019  ? MCV 87.0 06/05/2021 1019  ? MCH 29.3 06/01/2020 0450  ? MCHC 32.8 06/05/2021 1019  ? RDW 13.7 06/05/2021 1019  ? LYMPHSABS 1.3 06/05/2021 1019  ? MONOABS 0.5 06/05/2021 1019  ? EOSABS 0.2 06/05/2021 1019  ? BASOSABS 0.1 06/05/2021 1019   ? ? ?Chest Imaging: ?CT Chest 06/26/21 reviewed by me with moderate left effusion, LLL atelectasis ? ?CXR after thora today with small residual left effusion ? ?Pulmonary Functions Testing Results: ?No flowsheet data found. ? ?   ?Assessment & Plan:  ? ?Left pleural effusion: ?Likely parapneumonic but does have history  of left breast DCIS and colon cancer. Unfortunately don't have LDH, protein from pleural fluid from first thora. ? ?Plan: ?- s/p repeat thora today ?- pleural fluid chemistries, cytology ?- RTC 6 weeks with CXR ? ? ? ? ?Maryjane Hurter, MD ?[facility] ?06/27/2021 6:18 PM  ? ? ?

## 2021-06-27 ENCOUNTER — Telehealth: Payer: Self-pay | Admitting: Family

## 2021-06-27 ENCOUNTER — Telehealth: Payer: Self-pay

## 2021-06-27 ENCOUNTER — Other Ambulatory Visit: Payer: Self-pay

## 2021-06-27 ENCOUNTER — Ambulatory Visit (INDEPENDENT_AMBULATORY_CARE_PROVIDER_SITE_OTHER): Payer: BC Managed Care – PPO

## 2021-06-27 ENCOUNTER — Other Ambulatory Visit (HOSPITAL_COMMUNITY)
Admission: RE | Admit: 2021-06-27 | Discharge: 2021-06-27 | Disposition: A | Discharge status: Home / Self Care | Payer: BC Managed Care – PPO | Source: Ambulatory Visit | Attending: Pulmonary Disease | Admitting: Pulmonary Disease

## 2021-06-27 ENCOUNTER — Encounter: Payer: Self-pay | Admitting: Student

## 2021-06-27 ENCOUNTER — Ambulatory Visit: Payer: BC Managed Care – PPO | Admitting: Student

## 2021-06-27 VITALS — BP 116/78 | HR 87 | Temp 98.0°F | Ht 63.5 in | Wt 303.4 lb

## 2021-06-27 DIAGNOSIS — Z85038 Personal history of other malignant neoplasm of large intestine: Secondary | ICD-10-CM

## 2021-06-27 DIAGNOSIS — J90 Pleural effusion, not elsewhere classified: Secondary | ICD-10-CM | POA: Insufficient documentation

## 2021-06-27 DIAGNOSIS — J9811 Atelectasis: Secondary | ICD-10-CM | POA: Diagnosis not present

## 2021-06-27 DIAGNOSIS — J948 Other specified pleural conditions: Secondary | ICD-10-CM | POA: Diagnosis not present

## 2021-06-27 LAB — BODY FLUID CELL COUNT WITH DIFFERENTIAL
Eos, Fluid: 2 %
Lymphs, Fluid: 33 %
Monocyte-Macrophage-Serous Fluid: 62 % (ref 50–90)
Neutrophil Count, Fluid: 3 % (ref 0–25)
Total Nucleated Cell Count, Fluid: 953 cu mm (ref 0–1000)

## 2021-06-27 IMAGING — DX DG CHEST 2V
2 series · 2 of 2 positions shown · non-contrast
Comparison: [DATE]

CLINICAL DATA: Status post thoracentesis.

EXAM:
CHEST - 2 VIEW

[chest pa]
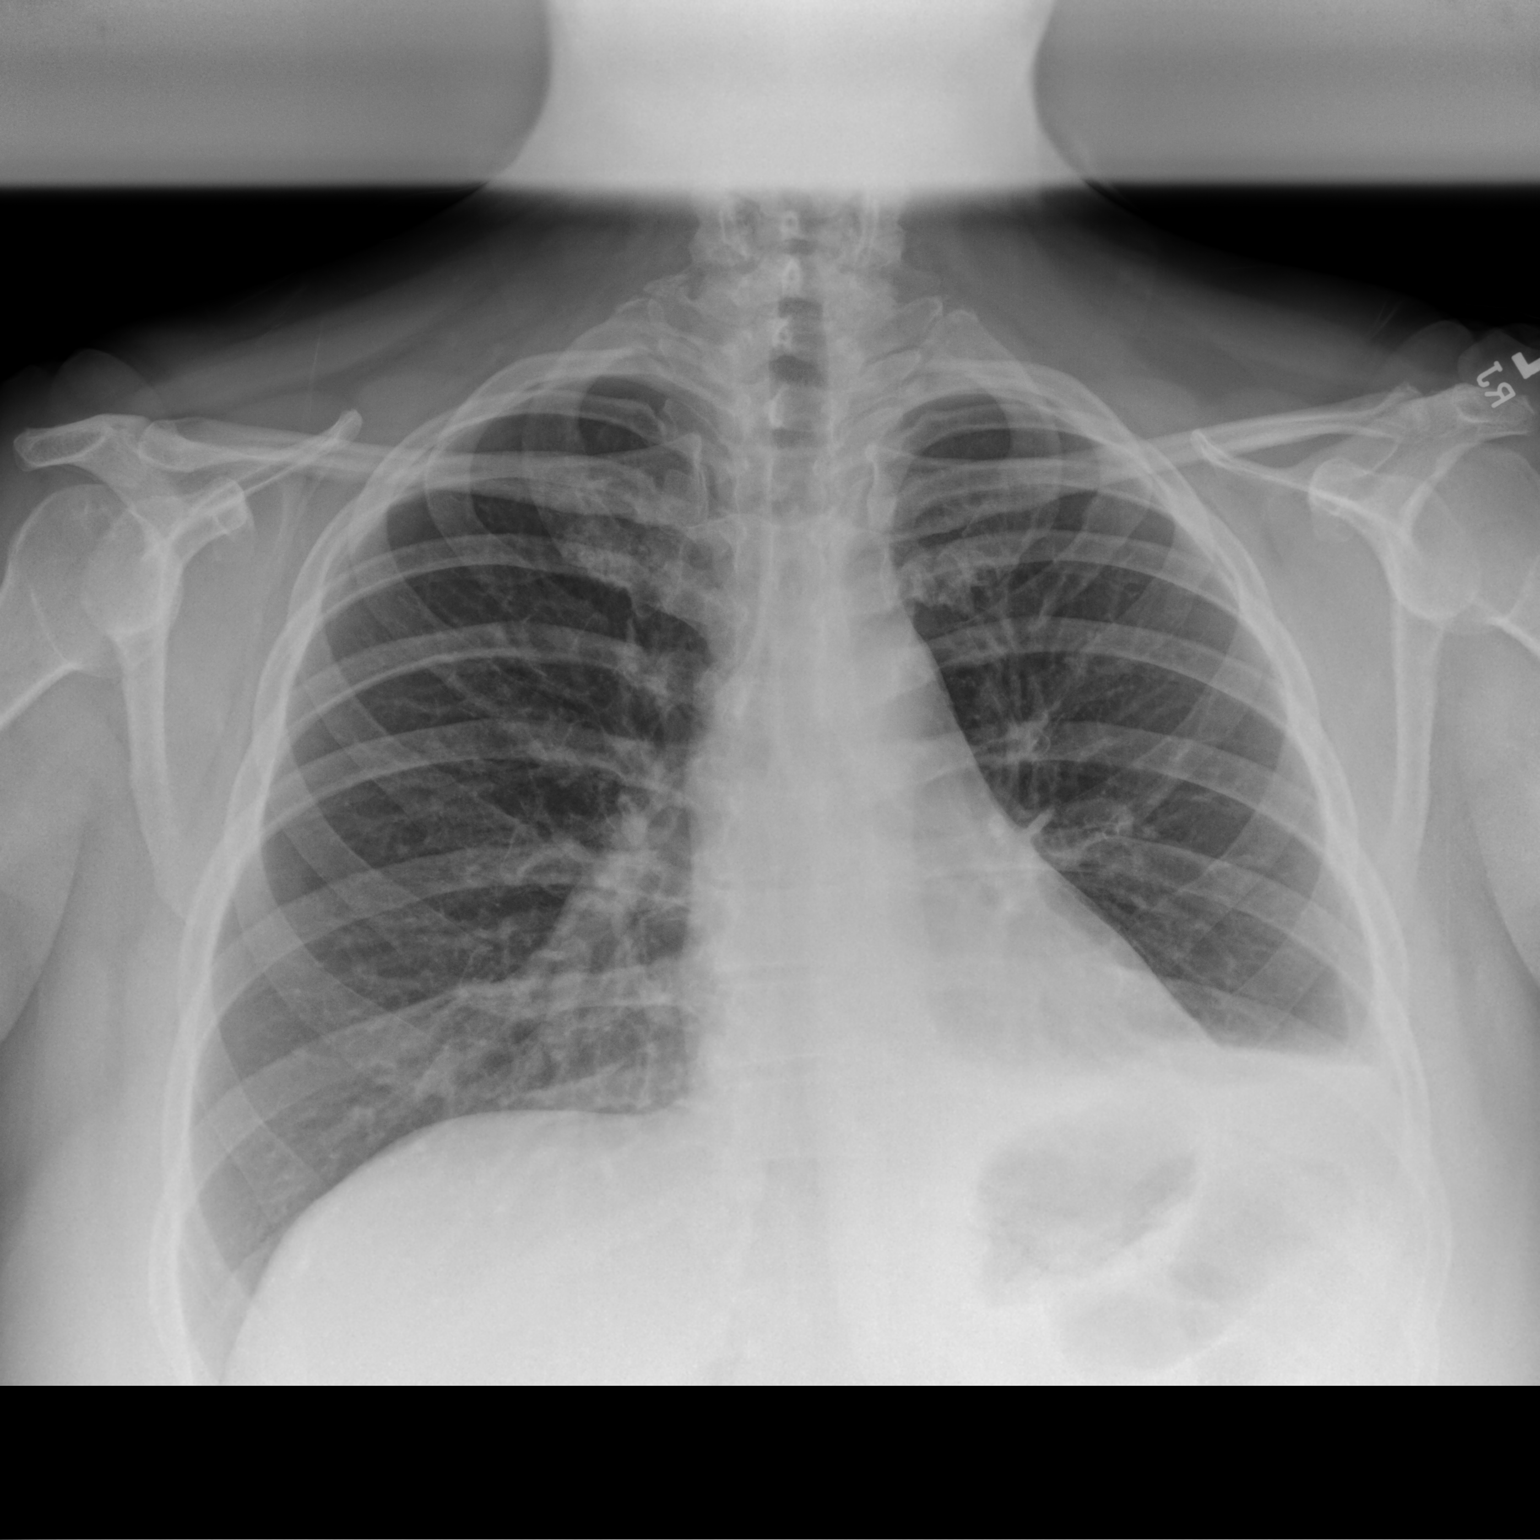

[chest lat]
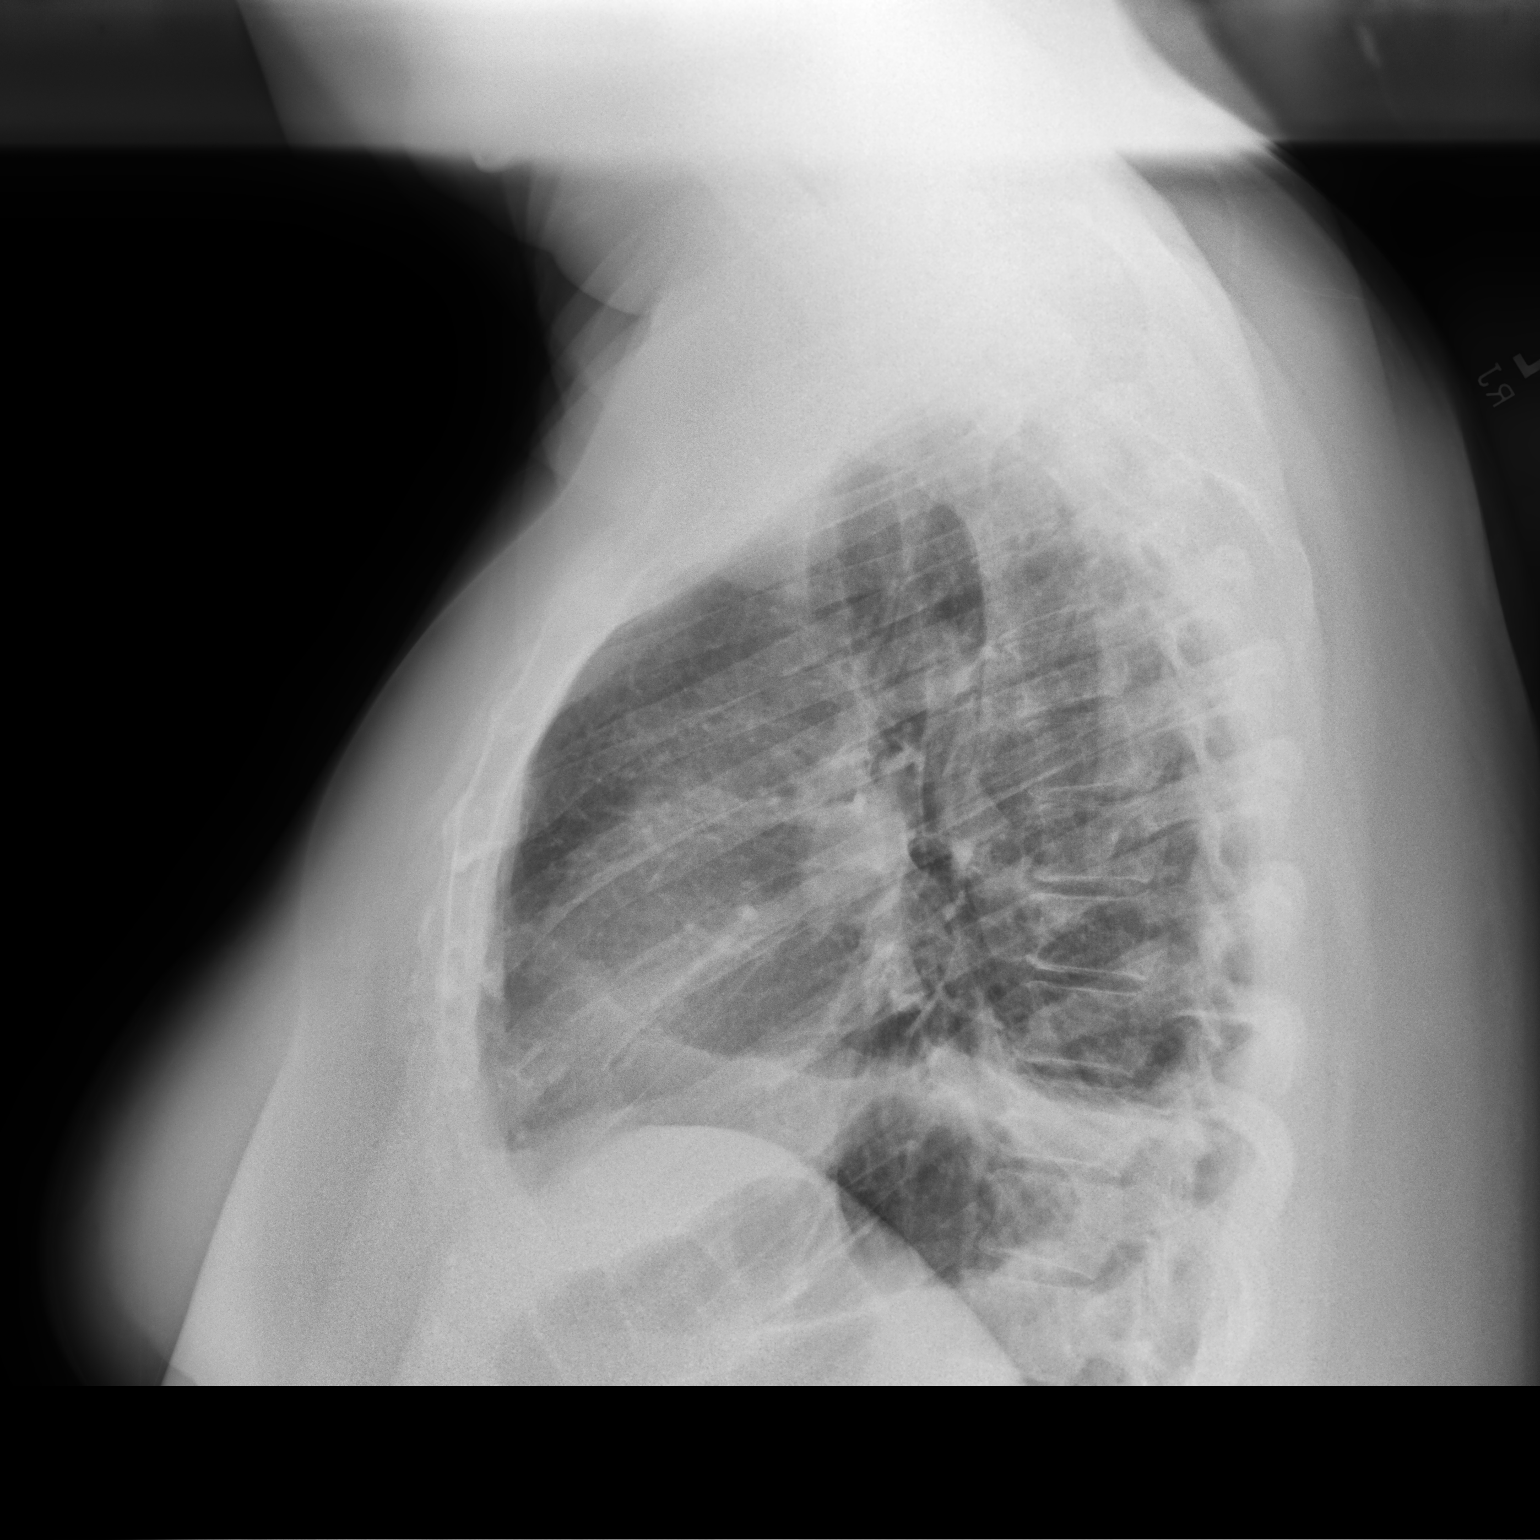

[2 of 2 positions shown; findings below may reference images not displayed]

FINDINGS: There is a very mild of atelectasis seen within the left lung base.
A small left pleural effusion is noted. This is decreased in
severity when compared to the prior study. No pneumothorax is
identified. The heart size and mediastinal contours are within
normal limits. The visualized skeletal structures are unremarkable.
IMPRESSION: 1. Very mild left basilar atelectasis.
2. Small left pleural effusion, decreased in severity when compared
to the prior study.
3. No evidence of pneumothorax, status post thoracentesis.

## 2021-06-27 MED ORDER — FERROUS SULFATE 325 (65 FE) MG PO TABS: 325.0000 mg | ORAL_TABLET | ORAL | 3 refills | Status: DC

## 2021-06-27 NOTE — Patient Instructions (Signed)
-   x ray today ?- see you in 6 weeks  ?

## 2021-06-27 NOTE — Telephone Encounter (Signed)
-----   Message from Gatha Mayer, MD sent at 06/27/2021  9:35 AM EST ----- ?Regarding: 5/15 colonoscopy ?Please set up colonoscopy for this date - dx is hx colon cancer ? ?Thanks ? ?CEG ? ?

## 2021-06-27 NOTE — Telephone Encounter (Signed)
Lvm for patient to call back about this message ? ?Need to ask if patient was on her period at time of the visit due to blood in ua and forward to provider with comments  ?

## 2021-06-27 NOTE — Telephone Encounter (Signed)
Pt scheduled for Colonoscopy on 09/09/2021 at 8:45 at [facility] with Dr. Carlean Purl: Case # [id number] ?Left message for pt to call back:  ?

## 2021-06-27 NOTE — Telephone Encounter (Signed)
B12 is in the normal range.  ? ?Iron is a little low. I would recommend that she add iron 325mg  one tablet by mouth every other day.  ? ?There was blood in her urine. Did she have her period that day?  ?

## 2021-06-27 NOTE — Telephone Encounter (Signed)
Pt aware and voices understanding.  ? ?Pt says she was just starting her period that day.  ?

## 2021-06-27 NOTE — Progress Notes (Signed)
Thoracentesis  Procedure Note ? ?Elizabeth Dixon  ?[id number]  ?[birth year] ? ?Date:06/27/21  ?Time:6:22 PM  ? ?Provider Performing:Lavoris Sparling Merlene Pulling  ? ?Procedure: Thoracentesis with imaging guidance (16967) ? ?Indication(s) ?Pleural Effusion ? ?Consent ?Risks of the procedure as well as the alternatives and risks of each were explained to the patient and/or caregiver.  Consent for the procedure was obtained and is signed in the bedside chart ? ?Anesthesia ?Topical only with 1% lidocaine  ? ? ?Time Out ?Verified patient identification, verified procedure, site/side was marked, verified correct patient position, special equipment/implants available, medications/allergies/relevant history reviewed, required imaging and test results available. ? ? ?Sterile Technique ?Maximal sterile technique including full sterile barrier drape, hand hygiene, sterile gown, sterile gloves, mask, hair covering, sterile ultrasound probe cover (if used). ? ?Procedure Description ?Ultrasound was used to identify appropriate pleural anatomy for placement and overlying skin marked.  Area of drainage cleaned and draped in sterile fashion. Lidocaine was used to anesthetize the skin and subcutaneous tissue.  1000 cc's of amber appearing fluid was drained from the left pleural space. Catheter then removed and bandaid applied to site. ? ? ?Complications/Tolerance ?None; patient tolerated the procedure well. ?Chest X-ray is ordered to confirm no post-procedural complication. ? ? ?EBL ?Minimal ? ? ?Specimen(s) ?Pleural fluid ? ? ? ? ? ? ? ? ?  ?

## 2021-06-27 NOTE — Telephone Encounter (Signed)
Pt called back during meeting today. I lmom asking for call back  ?

## 2021-06-28 LAB — CYTOLOGY - NON PAP

## 2021-06-28 LAB — LACTATE DEHYDROGENASE, PLEURAL OR PERITONEAL FLUID: LD, Fluid: 124 U/L — ABNORMAL HIGH (ref 3–23)

## 2021-06-28 LAB — PROTEIN, PLEURAL OR PERITONEAL FLUID: Total protein, fluid: 5.2 g/dL

## 2021-07-01 ENCOUNTER — Telehealth: Payer: Self-pay | Admitting: Family

## 2021-07-01 NOTE — Telephone Encounter (Signed)
07/01/2021 LMTCB ?

## 2021-07-01 NOTE — Telephone Encounter (Signed)
Form taken to the back started and put in provider's folder ?

## 2021-07-01 NOTE — Telephone Encounter (Signed)
Forms faxed into front office ?Placed into bin up front  ?

## 2021-07-02 ENCOUNTER — Encounter: Payer: BC Managed Care – PPO | Admitting: Internal Medicine

## 2021-07-02 ENCOUNTER — Telehealth: Payer: Self-pay | Admitting: Family

## 2021-07-02 LAB — LD, BODY FLUID (OTHER): LD, Body Fluid: 144 IU/L

## 2021-07-02 LAB — GRAM STAIN/BODY FLUID CULTURE: Organism ID, Bacteria: NONE SEEN

## 2021-07-02 LAB — BODY FLUID CULTURE W GRAM STAIN: Culture: NO GROWTH

## 2021-07-02 LAB — PROTEIN, BODY FLUID (OTHER): Protein, Fluid: 5.2 g/dL

## 2021-07-02 NOTE — Telephone Encounter (Signed)
Left message for pt to call back  °

## 2021-07-02 NOTE — Telephone Encounter (Signed)
See mychart.  

## 2021-07-03 DIAGNOSIS — Z0279 Encounter for issue of other medical certificate: Secondary | ICD-10-CM

## 2021-07-04 ENCOUNTER — Other Ambulatory Visit: Payer: BC Managed Care – PPO

## 2021-07-04 NOTE — Telephone Encounter (Signed)
Left message for pt to call back  °

## 2021-07-05 ENCOUNTER — Institutional Professional Consult (permissible substitution): Payer: BC Managed Care – PPO | Admitting: Pulmonary Disease

## 2021-07-05 NOTE — Telephone Encounter (Signed)
Left message for pt to call back: ?Spoke with  pt Husband Teliah [place] [phone]: and requested if he could tell [place] to call us at the office at [phone]: ? ?

## 2021-07-09 ENCOUNTER — Telehealth: Payer: Self-pay

## 2021-07-09 NOTE — Telephone Encounter (Signed)
Inbound call from patient's husband requesting a call back from the nurse please. ?

## 2021-07-09 NOTE — Telephone Encounter (Signed)
Pt Husband Vonna Kotyk notified of Colonoscopy that is scheduled at [facility]  for 09/09/2021 at 8: 48 AM: [place] to arrive at 7:15 AM: Case number [phone] ?Previsit scheduled for 08/12/2021 at 10:00: Telephone appointment: Vonna Kotyk made aware:  ?Vonna Kotyk verbalized understanding with all questions answered.   ?

## 2021-08-06 ENCOUNTER — Encounter: Payer: Self-pay | Admitting: Student

## 2021-08-06 ENCOUNTER — Ambulatory Visit: Payer: BC Managed Care – PPO | Admitting: Student

## 2021-08-06 ENCOUNTER — Ambulatory Visit (INDEPENDENT_AMBULATORY_CARE_PROVIDER_SITE_OTHER): Payer: BC Managed Care – PPO

## 2021-08-06 ENCOUNTER — Other Ambulatory Visit: Payer: Self-pay | Admitting: Student

## 2021-08-06 VITALS — BP 116/78 | HR 90 | Temp 97.8°F | Ht 63.5 in | Wt 307.0 lb

## 2021-08-06 DIAGNOSIS — J90 Pleural effusion, not elsewhere classified: Secondary | ICD-10-CM

## 2021-08-06 IMAGING — DX DG CHEST 2V
2 series · 2 of 2 positions shown · non-contrast
Comparison: [DATE]

CLINICAL DATA: 45-year-old female with pleural effusion

EXAM:
CHEST - 2 VIEW

[chest pa]
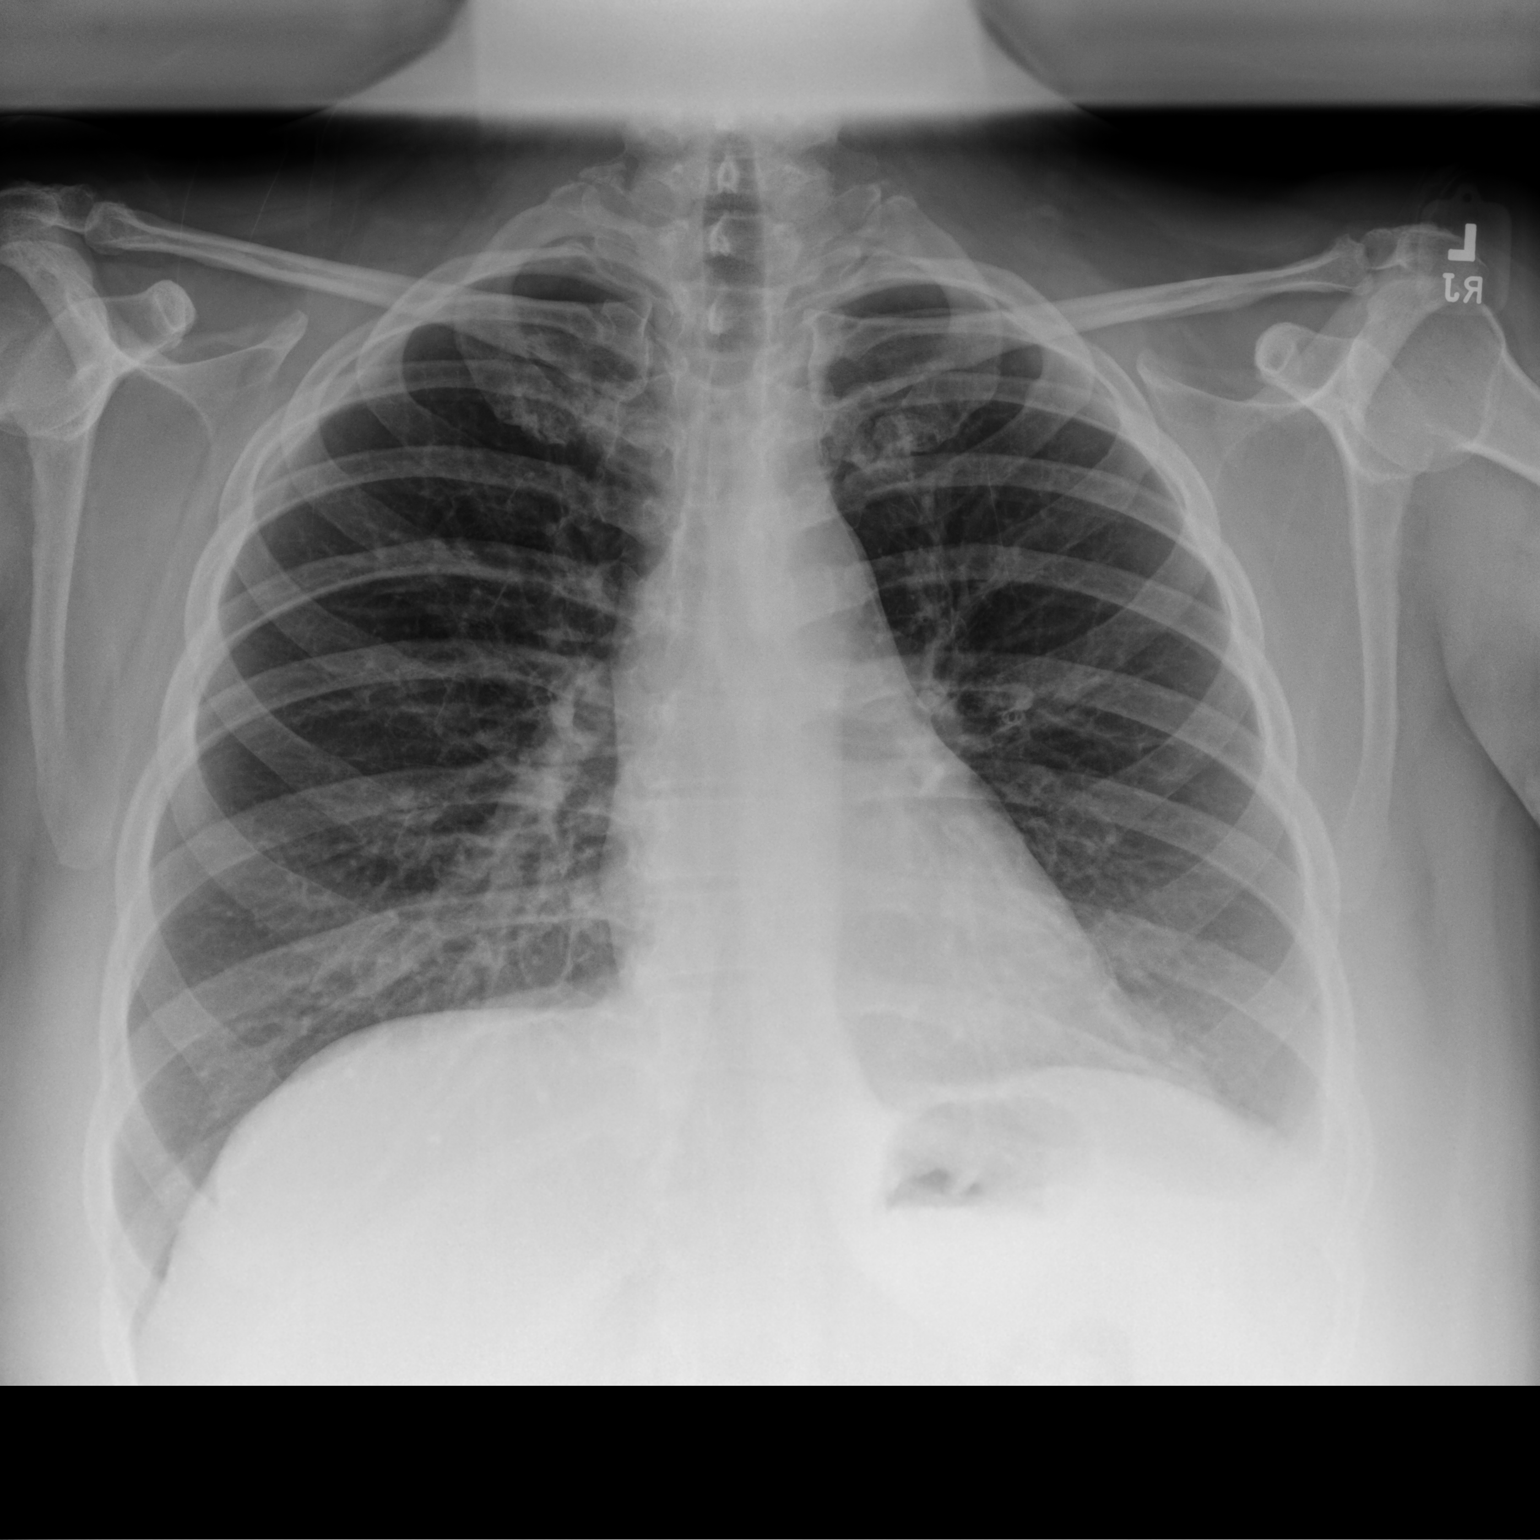

[chest lat]
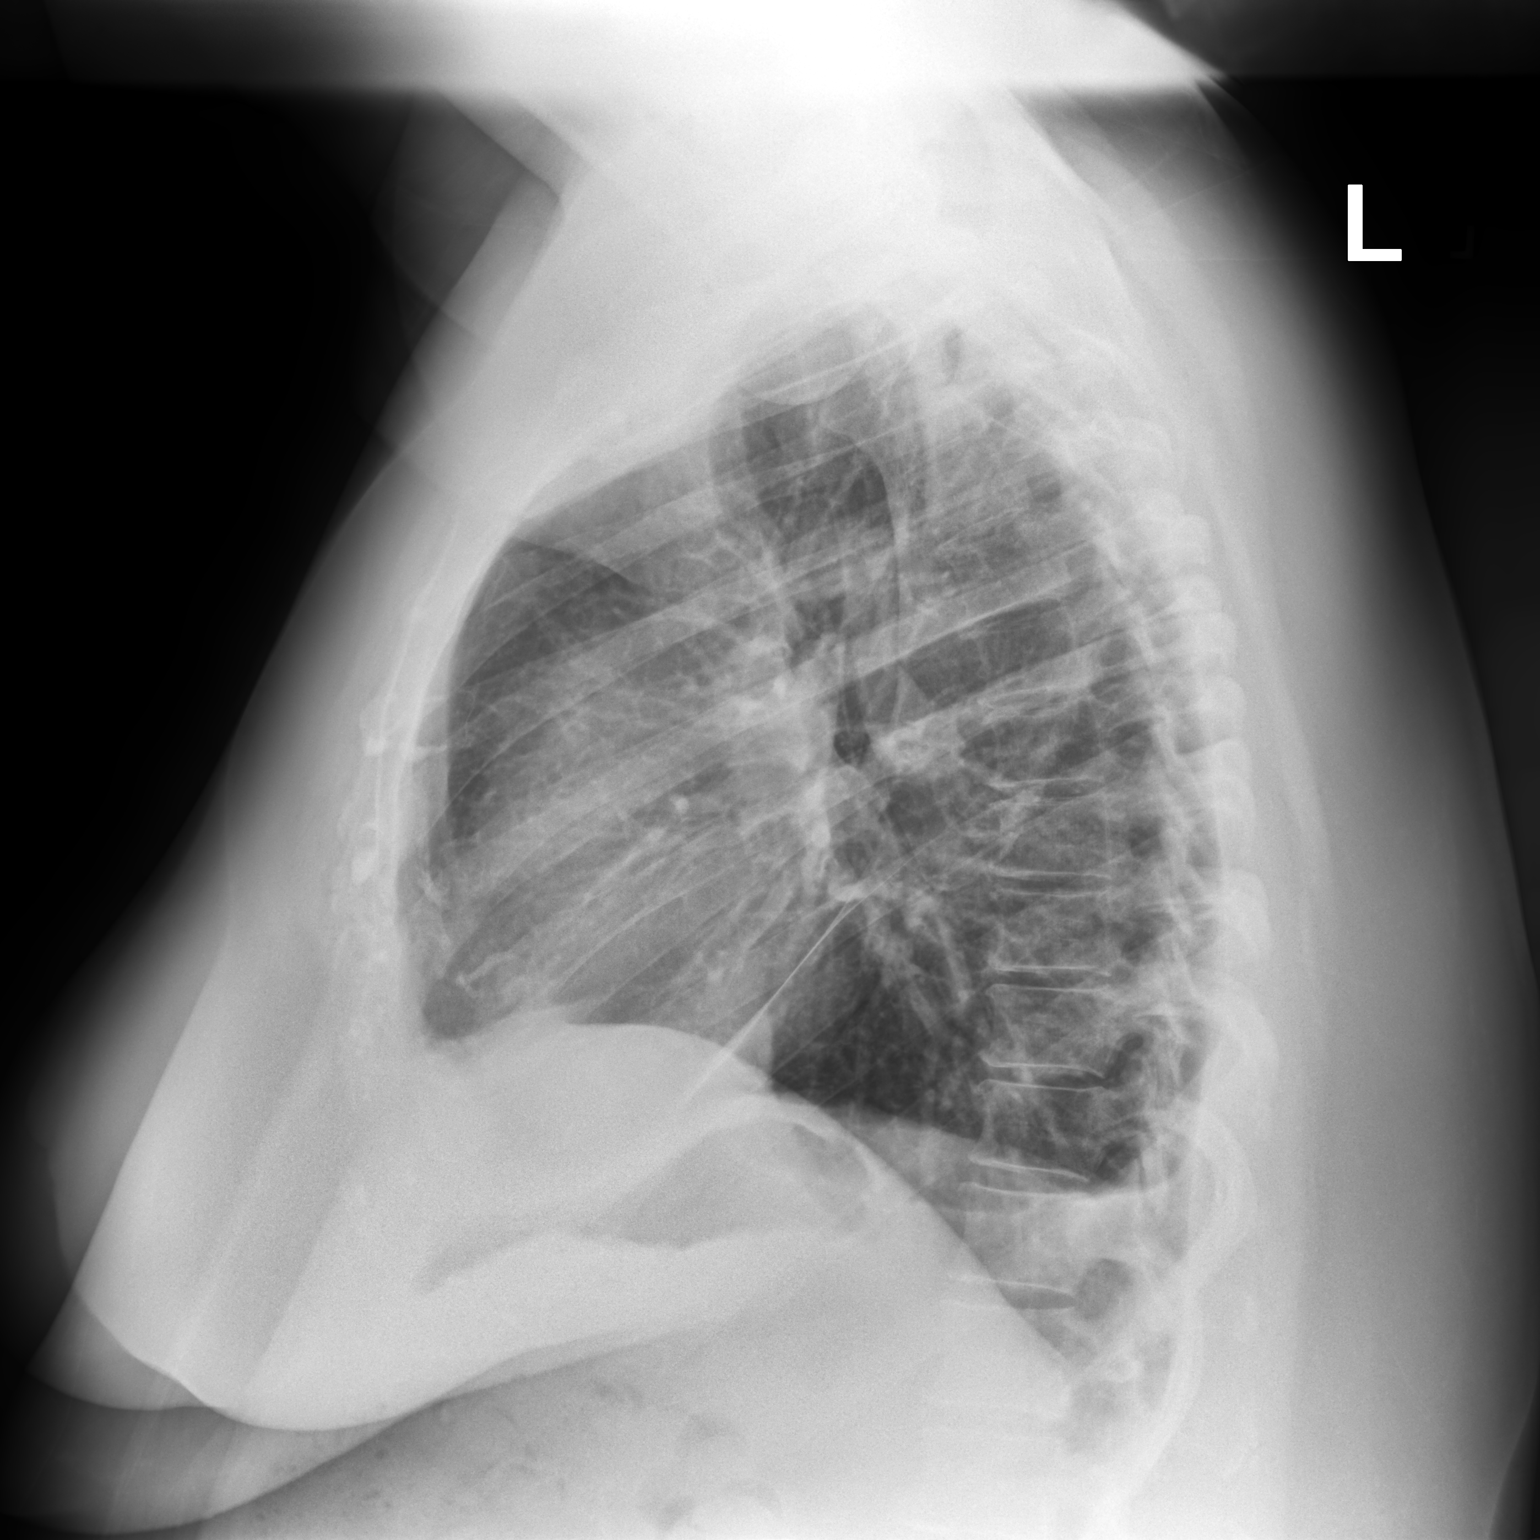

[2 of 2 positions shown; findings below may reference images not displayed]

FINDINGS: Cardiomediastinal silhouette unchanged in size and contour. No
interlobular septal thickening. No central vascular congestion. Low
lung volumes.

Meniscus within the sulcus on the lateral view, as well as overlying
the left hemidiaphragm. Decreased volume compared to the prior. No
new confluent airspace disease. No pneumothorax.
IMPRESSION: Small left pleural effusion, decreased from the prior.

## 2021-08-06 NOTE — Patient Instructions (Signed)
-   If effusion recurs on imaging or if you develop worsening shortness of breath with exertion, persistent fever, left sided chest pain then come back to see [place]. Send mychart message or call to get appointment.  ?- Mask up around your mom when you visit ?

## 2021-08-06 NOTE — Progress Notes (Signed)
? ?Synopsis: Referred for pleural effusion by [name], NP ? ?Subjective:  ? ?PATIENT ID: Elizabeth Dixon GENDER: female DOB: 31-Jul-1975, MRN: [id number] ? ?Chief Complaint  ?Patient presents with  ? Follow-up  ?  Breathing is much improved. She says she can feel some discomfort in the left lower part of her lung when she takes a deep breath "feels like a vibration".   ? ?46yF with history of L breast DCIS, MS referred for pleural effusion ? ?Went to ED 2/14 admitted at [facility], underwent left thora with only 50cc drained, given IV ABX. She originally did have productive cough, hemoptysis, then some dyspnea and dry cough after completing course of ABX. Feels overall pretty close to back to normal. Can feel some chest tightness and dyspnea when she lies down on her left side.  ? ?Had CT Chest yesterday with moderate left effusion. ? ?No family history of lung cancer. Dad with prostate, skin cancer ? 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 ? ?Interval HPI: ? ?She feels better since last visit. Every once in a while when she takes a deep breath she feels 'a vibration' on left side. No cough. No fever. No dyspnea. No joint pain, rashes.  ? ?Otherwise pertinent review of systems is negative. ? ? ?Past Medical History:  ?Diagnosis Date  ? Anemia   ? Breast cancer ([place]) 2016  ? left breast- ductal carcinoma in situ  ? Family history of breast cancer   ? Family history of prostate cancer   ? Hyperlipidemia   ? hx of  ? Iron deficiency anemia   ? hx of  ? Multiple sclerosis ([place])   ? dx 2002  ? Neuromuscular disorder ([place])   ?  ? ?Family History  ?Problem Relation Age of Onset  ? Thyroid disease Mother   ? Hypertension Father   ? Prostate cancer Father 53  ? Skin cancer Father 78  ? Parkinson's disease Paternal Grandmother   ? Breast cancer Other   ?     unconfirmed diagnosis, great-aunt (MGF's sister)  ? Colon cancer Neg Hx   ? Colon polyps Neg Hx   ? Esophageal cancer Neg Hx   ? Stomach  cancer Neg Hx   ? Rectal cancer Neg Hx   ?  ? ?Past Surgical History:  ?Procedure Laterality Date  ? BREAST LUMPECTOMY Left 08/2014  ? BREAST REDUCTION SURGERY Bilateral 08/2014  ? CESAREAN SECTION  2013  ? FLEXIBLE SIGMOIDOSCOPY N/A 05/31/2020  ? Procedure: FLEXIBLE SIGMOIDOSCOPY, ICG INTRAOP ASSESSMENT OF PERFUSION;  Surgeon: Ileana Roup, MD;  Location: [facility];  Service: General;  Laterality: N/A;  ? SIGMOIDECTOMY  05/31/2020  ? XI ROBOT ASSISTED SIGMOID COLECTOMY WITH COLORECTAL ANASTOMOSIS  ? VAGINA SURGERY    ? vagina tear from childbirth  ? WISDOM TOOTH EXTRACTION    ? ? ?Social History  ? ?Socioeconomic History  ? Marital status: Married  ?  Spouse name: Not on file  ? Number of children: Not on file  ? Years of education: Not on file  ? Highest education level: Not on file  ?Occupational History  ? Occupation: [employer] features editor  ?Tobacco Use  ? Smoking status: Former  ?  Packs/day: 1.00  ?  Years: 5.00  ?  Pack years: 5.00  ?  Types: Cigarettes  ?  Quit date: 04/29/2003  ?  Years since quitting: 18.2  ? Smokeless tobacco: Never  ?Vaping Use  ? Vaping  Use: Never used  ?Substance and Sexual Activity  ? Alcohol use: Yes  ?  Alcohol/week: 1.0 standard drink  ?  Types: 1 Standard drinks or equivalent per week  ?  Comment: once a wk  ? Drug use: Never  ? Sexual activity: Yes  ?  Partners: Male  ?Other Topics Concern  ? Not on file  ?Social History Narrative  ? Works as an Geologist, engineering  ? Married-  Children 2013 son and 2011 daughter  ? Moved from [facility]  ? Enjoys children's sports  ? Enjoys outside activities  ? dog  ? Complete bachelors degree  ?   ? Right handed  ? ?Social Determinants of Health  ? ?Financial Resource Strain: Not on file  ?Food Insecurity: Not on file  ?Transportation Needs: Not on file  ?Physical Activity: Not on file  ?Stress: Not on file  ?Social Connections: Not on file  ?Intimate Partner Violence: Not on file  ?  ? ?No Known Allergies  ? ?Outpatient Medications Prior to Visit  ?Medication Sig  Dispense Refill  ? albuterol (VENTOLIN HFA) 108 (90 Base) MCG/ACT inhaler Use one puff as needed for shortness of breath    ? cholecalciferol (VITAMIN D3) 25 MCG (1000 UNIT) tablet Take 1,000 Units by mouth daily.    ? ferrous sulfate 325 (65 FE) MG tablet Take 325 mg by mouth every other day.    ? fluticasone (FLONASE) 50 MCG/ACT nasal spray Place 2 sprays into both nostrils daily. (Patient taking differently: Place 2 sprays into both nostrils daily as needed.) 1 g 0  ? Multiple Vitamins-Minerals (MULTIVITAMIN WITH MINERALS) tablet Take 1 tablet by mouth daily.    ? ?No facility-administered medications prior to visit.  ? ? ? ? ? ?Objective:  ? ?Physical Exam: ? ?General appearance: 46 y.o., female, NAD, conversant  ?Eyes: anicteric sclerae; PERRL, tracking appropriately ?HENT: NCAT; MMM ?Neck: Trachea midline; no lymphadenopathy, no JVD ?Lungs: CTAB, no crackles, no wheeze, with normal respiratory effort ?CV: RRR, no murmur  ?Abdomen: Soft, non-tender; non-distended, BS present  ?Extremities: No peripheral edema, warm ?Skin: Normal turgor and texture; no rash ?Psych: Appropriate affect ?Neuro: Alert and oriented to person and place, no focal deficit  ? ? ? ?Vitals:  ? 08/06/21 1522  ?BP: 116/78  ?Pulse: 90  ?Temp: 97.8 ?F (36.6 ?C)  ?TempSrc: Oral  ?SpO2: 95%  ?Weight: (!) 307 lb (139.3 kg)  ?Height: 5' 3.5" (1.613 m)  ? ? ?95% on RA ?BMI Readings from Last 3 Encounters:  ?08/06/21 53.53 kg/m?  ?06/27/21 52.90 kg/m?  ?06/21/21 53.53 kg/m?  ? ?Wt Readings from Last 3 Encounters:  ?08/06/21 (!) 307 lb (139.3 kg)  ?06/27/21 (!) 303 lb 6.4 oz (137.6 kg)  ?06/21/21 (!) 307 lb (139.3 kg)  ? ? ? ?CBC ?   ?Component Value Date/Time  ? WBC 7.2 06/05/2021 1019  ? RBC 4.14 06/05/2021 1019  ? HGB 11.8 (L) 06/05/2021 1019  ? HCT 36.0 06/05/2021 1019  ? PLT 318.0 06/05/2021 1019  ? MCV 87.0 06/05/2021 1019  ? MCH 29.3 06/01/2020 0450  ? MCHC 32.8 06/05/2021 1019  ? RDW 13.7 06/05/2021 1019  ? LYMPHSABS 1.3 06/05/2021 1019  ?  MONOABS 0.5 06/05/2021 1019  ? EOSABS 0.2 06/05/2021 1019  ? BASOSABS 0.1 06/05/2021 1019  ? ? ?Chest Imaging: ?CT Chest 06/26/21 reviewed by me with moderate left effusion, LLL atelectasis ? ?CXR after thora 06/27/21 with small residual left effusion ? ?CXR today reviewed by me with possibly  small left pleural effusion ? ?Bedside US of left pleural space performed and interpreted by me with small left pleural effusion which appears simple: ? ? ? ? ?Pulmonary Functions Testing Results: ?   ? View : No data to display.  ?  ?  ?  ? ?OSH spirometry reviewed by me normal ?   ?Assessment & Plan:  ? ?Exudative left pleural effusion: ?Likely parapneumonic. ? ?Plan: ?If effusion recurs on imaging or if you develop worsening shortness of breath with exertion, persistent fever, constitutional symptoms, left sided chest pain then encouraged to schedule an appointment ? ? ?RTC prn ? ? ? ? ?Maryjane Hurter, MD ?[facility] ?08/06/2021 6:03 PM  ? ? ?

## 2021-08-12 ENCOUNTER — Ambulatory Visit (AMBULATORY_SURGERY_CENTER): Payer: BC Managed Care – PPO | Admitting: *Deleted

## 2021-08-12 ENCOUNTER — Other Ambulatory Visit: Payer: Self-pay

## 2021-08-12 VITALS — Ht 64.0 in | Wt 308.0 lb

## 2021-08-12 DIAGNOSIS — Z85038 Personal history of other malignant neoplasm of large intestine: Secondary | ICD-10-CM

## 2021-08-12 NOTE — Progress Notes (Signed)
No egg or soy allergy known to patient  ?No issues known to pt with past sedation with any surgeries or procedures ?Patient denies ever being told they had issues or difficulty with intubation  ?No FH of Malignant Hyperthermia ?Pt is not on diet pills ?Pt is not on  home 02  ?Pt is not on blood thinners  ?Pt denies issues with constipation  ?No A fib or A flutter ? ? Coupon to pt in PV today , Code to Pharmacy and  NO PA's for preps discussed with pt In PV today  ?Discussed with pt there will be an out-of-pocket cost for prep and that varies from $0 to 70 +  dollars - pt verbalized understanding  ?Pt instructed to use Singlecare.com or GoodRx for a price reduction on prep  ? ?PV completed over the phone. Pt verified name, DOB, address and insurance during PV today.  ?Pt mailed instruction packet with copy of consent form to read and not return, and instructions.  ?Pt encouraged to call with questions or issues.  ?If pt has My chart, procedure instructions sent via My Chart   ?

## 2021-08-14 ENCOUNTER — Ambulatory Visit: Payer: BC Managed Care – PPO | Admitting: Student

## 2021-09-02 ENCOUNTER — Encounter (HOSPITAL_COMMUNITY): Payer: Self-pay | Admitting: Internal Medicine

## 2021-09-02 NOTE — Progress Notes (Signed)
Attempted to obtain medical history via telephone, unable to reach at this time. I left a voicemail to return pre surgical testing department's phone call.  

## 2021-09-06 ENCOUNTER — Encounter: Payer: Self-pay | Admitting: Internal Medicine

## 2021-09-08 NOTE — Anesthesia Preprocedure Evaluation (Addendum)
Anesthesia Evaluation  ?Patient identified by MRN, date of birth, ID band ?Patient awake ? ? ? ?Reviewed: ?Allergy & Precautions, NPO status , Patient's Chart, lab work & pertinent test results ? ?History of Anesthesia Complications ?Negative for: history of anesthetic complications ? ?Airway ?Mallampati: II ? ?TM Distance: >3 FB ?Neck ROM: Full ? ? ? Dental ? ?(+) Dental Advisory Given ?  ?Pulmonary ?COPD,  COPD inhaler, former smoker,  ?  ?breath sounds clear to auscultation ? ? ? ? ? ? Cardiovascular ?negative cardio ROS ? ? ?Rhythm:Regular Rate:Normal ? ? ?  ?Neuro/Psych ?Multiple sclerosis ?  ? GI/Hepatic ?Neg liver ROS, Colon cancer ?  ?Endo/Other  ?Morbid obesity ? Renal/GU ?negative Renal ROS  ? ?  ?Musculoskeletal ? ? Abdominal ?  ?Peds ? Hematology ? ?(+) Blood dyscrasia, anemia ,   ?Anesthesia Other Findings ?H/o breast cancer ? Reproductive/Obstetrics ? ?  ? ? ? ? ? ? ? ? ? ? ? ? ? ?  ?  ? ? ? ? ? ? ? ?Anesthesia Physical ?Anesthesia Plan ? ?ASA: 3 ? ?Anesthesia Plan: MAC  ? ?Post-op Pain Management: Minimal or no pain anticipated  ? ?Induction:  ? ?PONV Risk Score and Plan: 2 and Ondansetron and Treatment may vary due to age or medical condition ? ?Airway Management Planned: Natural Airway and Simple Face Mask ? ?Additional Equipment: None ? ?Intra-op Plan:  ? ?Post-operative Plan:  ? ?Informed Consent: I have reviewed the patients History and Physical, chart, labs and discussed the procedure including the risks, benefits and alternatives for the proposed anesthesia with the patient or authorized representative who has indicated his/her understanding and acceptance.  ? ? ? ?Dental advisory given ? ?Plan Discussed with: CRNA and Surgeon ? ?Anesthesia Plan Comments:   ? ? ? ? ? ?Anesthesia Quick Evaluation ? ?

## 2021-09-09 ENCOUNTER — Ambulatory Visit (HOSPITAL_COMMUNITY): Payer: BC Managed Care – PPO | Admitting: Anesthesiology

## 2021-09-09 ENCOUNTER — Encounter (HOSPITAL_COMMUNITY)
Admission: RE | Disposition: A | Discharge status: Home / Self Care | Payer: Self-pay | Source: Home / Self Care | Attending: Internal Medicine

## 2021-09-09 ENCOUNTER — Other Ambulatory Visit: Payer: Self-pay

## 2021-09-09 ENCOUNTER — Encounter (HOSPITAL_COMMUNITY): Payer: Self-pay | Admitting: Internal Medicine

## 2021-09-09 ENCOUNTER — Ambulatory Visit (HOSPITAL_COMMUNITY)
Admission: RE | Admit: 2021-09-09 | Discharge: 2021-09-09 | Disposition: A | Discharge status: Home / Self Care | Payer: BC Managed Care – PPO | Attending: Internal Medicine | Admitting: Internal Medicine

## 2021-09-09 DIAGNOSIS — K5730 Diverticulosis of large intestine without perforation or abscess without bleeding: Secondary | ICD-10-CM | POA: Diagnosis not present

## 2021-09-09 DIAGNOSIS — Z1211 Encounter for screening for malignant neoplasm of colon: Secondary | ICD-10-CM | POA: Insufficient documentation

## 2021-09-09 DIAGNOSIS — Z980 Intestinal bypass and anastomosis status: Secondary | ICD-10-CM | POA: Diagnosis not present

## 2021-09-09 DIAGNOSIS — G35 Multiple sclerosis: Secondary | ICD-10-CM | POA: Diagnosis not present

## 2021-09-09 DIAGNOSIS — D649 Anemia, unspecified: Secondary | ICD-10-CM | POA: Diagnosis not present

## 2021-09-09 DIAGNOSIS — Z87891 Personal history of nicotine dependence: Secondary | ICD-10-CM | POA: Diagnosis not present

## 2021-09-09 DIAGNOSIS — J449 Chronic obstructive pulmonary disease, unspecified: Secondary | ICD-10-CM | POA: Insufficient documentation

## 2021-09-09 DIAGNOSIS — Z6841 Body Mass Index (BMI) 40.0 and over, adult: Secondary | ICD-10-CM | POA: Diagnosis not present

## 2021-09-09 DIAGNOSIS — Z85038 Personal history of other malignant neoplasm of large intestine: Secondary | ICD-10-CM | POA: Insufficient documentation

## 2021-09-09 DIAGNOSIS — Z853 Personal history of malignant neoplasm of breast: Secondary | ICD-10-CM | POA: Insufficient documentation

## 2021-09-09 HISTORY — PX: COLONOSCOPY WITH PROPOFOL: SHX5780

## 2021-09-09 SURGERY — COLONOSCOPY WITH PROPOFOL
Anesthesia: Monitor Anesthesia Care

## 2021-09-09 MED ORDER — PROPOFOL 1000 MG/100ML IV EMUL
INTRAVENOUS | Status: AC
  Filled 2021-09-09: qty 100

## 2021-09-09 MED ORDER — PROPOFOL 500 MG/50ML IV EMUL
INTRAVENOUS | Status: DC | PRN
Start: 2021-09-09 — End: 2021-09-09
  Administered 2021-09-09: 135 ug/kg/min via INTRAVENOUS

## 2021-09-09 MED ORDER — PROPOFOL 10 MG/ML IV BOLUS
INTRAVENOUS | Status: DC | PRN
  Administered 2021-09-09 (×2): 30 mg via INTRAVENOUS

## 2021-09-09 MED ORDER — LACTATED RINGERS IV SOLN
INTRAVENOUS | Status: DC
Start: 2021-09-09 — End: 2021-09-09
  Administered 2021-09-09: 1000 mL via INTRAVENOUS

## 2021-09-09 SURGICAL SUPPLY — 22 items

## 2021-09-09 NOTE — Anesthesia Postprocedure Evaluation (Signed)
Anesthesia Post Note ? ?Patient: Elizabeth Dixon ? ?Procedure(s) Performed: COLONOSCOPY WITH PROPOFOL ? ?  ? ?Patient location during evaluation: Endoscopy ?Anesthesia Type: MAC ?Level of consciousness: awake and alert, patient cooperative and oriented ?Pain management: pain level controlled ?Vital Signs Assessment: post-procedure vital signs reviewed and stable ?Respiratory status: spontaneous breathing, nonlabored ventilation and respiratory function stable ?Cardiovascular status: blood pressure returned to baseline and stable ?Postop Assessment: no apparent nausea or vomiting, able to ambulate and adequate PO intake ?Anesthetic complications: no ? ? ?No notable events documented. ? ?Last Vitals:  ?Vitals:  ? 09/09/21 0940 09/09/21 0946  ?BP: 100/66 116/63  ?Pulse: (!) 50 (!) 52  ?Resp: 16 18  ?Temp:    ?SpO2: 100% 100%  ?  ?Last Pain:  ?Vitals:  ? 09/09/21 0946  ?TempSrc:   ?PainSc: 0-No pain  ? ? ?  ?  ?  ?  ?  ?  ? ?Elizabeth Dixon,E. Skarlette Lattner ? ? ? ? ?

## 2021-09-09 NOTE — Anesthesia Procedure Notes (Signed)
Procedure Name: [place] ?Date/Time: 09/09/2021 8:55 AM ?Performed by: Niel Hummer, CRNA ?Pre-anesthesia Checklist: Patient identified, Emergency Drugs available, Suction available and Patient being monitored ?Oxygen Delivery Method: Simple face mask ? ? ? ? ?

## 2021-09-09 NOTE — Op Note (Signed)
[facility] ?Patient Name: Elizabeth Dixon ?Procedure Date: 09/09/2021 ?MRN: [id number] ?Attending MD: Gatha Mayer , MD ?Date of Birth: 1975-05-08 ?CSN: [id number] ?Age: 46 ?Admit Type: Outpatient ?Procedure:                Colonoscopy ?Indications:              High risk colon cancer surveillance: Personal  ?                          history of colon cancer, Last colonoscopy: December  ?                          2021 ?Providers:                Gatha Mayer, MD, Carlyn Reichert, RN, Alphonzo Grieve  ?                          Leighton Roach, Technician ?Referring MD:              ?Medicines:                Monitored Anesthesia Care ?Complications:            No immediate complications. ?Estimated Blood Loss:     Estimated blood loss: none. ?Procedure:                Pre-Anesthesia Assessment: ?                          - Prior to the procedure, a History and Physical  ?                          was performed, and patient medications and  ?                          allergies were reviewed. The patient's tolerance of  ?                          previous anesthesia was also reviewed. The risks  ?                          and benefits of the procedure and the sedation  ?                          options and risks were discussed with the patient.  ?                          All questions were answered, and informed consent  ?                          was obtained. Prior Anticoagulants: The patient has  ?                          taken no previous anticoagulant or antiplatelet  ?                          agents. ASA Grade Assessment: III -  A patient with  ?                          severe systemic disease. After reviewing the risks  ?                          and benefits, the patient was deemed in  ?                          satisfactory condition to undergo the procedure. ?                          After obtaining informed consent, the colonoscope  ?                          was passed under direct vision. Throughout the   ?                          procedure, the patient's blood pressure, pulse, and  ?                          oxygen saturations were monitored continuously. The  ?                          CF-HQ190L ([id number]) Olympus colonoscope was  ?                          introduced through the anus and advanced to the the  ?                          cecum, identified by appendiceal orifice and  ?                          ileocecal valve. The colonoscopy was performed  ?                          without difficulty. The patient tolerated the  ?                          procedure well. The quality of the bowel  ?                          preparation was good. The ileocecal valve,  ?                          appendiceal orifice, and rectum were photographed.  ?                          The bowel preparation used was Miralax via split  ?                          dose instruction. ?Scope In: 9:01:23 AM ?Scope Out: 9:13:24 AM ?Scope Withdrawal Time: 0 hours 9 minutes 40 seconds  ?Total Procedure Duration: 0 hours 12 minutes 1 second  ?Findings: ?  The perianal and digital rectal examinations were normal. ?     There was evidence of a prior end-to-end colo-colonic anastomosis in the  ?     sigmoid colon. This was patent and was characterized by healthy  ?     appearing mucosa. The anastomosis was traversed. ?     A few diverticula were found in the sigmoid colon. ?     The exam was otherwise without abnormality on direct and retroflexion  ?     views. ?Impression:               - Patent end-to-end colo-colonic anastomosis,  ?                          characterized by healthy appearing mucosa. ?                          - Diverticulosis in the sigmoid colon. ?                          - The examination was otherwise normal on direct  ?                          and retroflexion views. ?                          - No specimens collected. ?                          - Personal history of malignant neoplasm of the  ?                           colon. sigmoid cancer resected 05/2020 and also 15  ?                          mm adenoma removed 03/2020 colonoscopy ?Moderate Sedation: ?     Not Applicable - Patient had care per Anesthesia. ?Recommendation:           - Patient has a contact number available for  ?                          emergencies. The signs and symptoms of potential  ?                          delayed complications were discussed with the  ?                          patient. Return to normal activities tomorrow.  ?                          Written discharge instructions were provided to the  ?                          patient. ?                          - Resume previous diet. ?                          -  Continue present medications. ?                          - Repeat colonoscopy in 3 years for surveillance. ?Procedure Code(s):        --- Professional --- ?                          G0105, Colorectal cancer screening; colonoscopy on  ?                          individual at high risk ?Diagnosis Code(s):        --- Professional --- ?                          K16.010, Personal history of other malignant  ?                          neoplasm of large intestine ?                          Z98.0, Intestinal bypass and anastomosis status ?                          K57.30, Diverticulosis of large intestine without  ?                          perforation or abscess without bleeding ?CPT copyright 2019 American Medical Association. All rights reserved. ?The codes documented in this report are preliminary and upon coder review may  ?be revised to meet current compliance requirements. ?Gatha Mayer, MD ?09/09/2021 9:21:33 AM ?This report has been signed electronically. ?Number of Addenda: 0 ?

## 2021-09-09 NOTE — H&P (Signed)
?[facility] History and Physical ? ? ?Primary Care Physician:  [name], NP ? ? ?Reason for Procedure:   Hx colon cancer ? ?Plan:    colonoscopy ? ? ? ? ?HPI: Elizabeth Dixon is a 46 y.o. female s/p resection sigmoid cancer 05/2020. Also 15 mm adenoma removed at 12/21 colonoscopy ? ? ?Past Medical History:  ?Diagnosis Date  ? Anemia   ? Breast cancer ([place]) 2016  ? left breast- ductal carcinoma in situ  ? Family history of breast cancer   ? Family history of prostate cancer   ? Hyperlipidemia   ? hx of  ? Iron deficiency anemia   ? hx of  ? Multiple sclerosis ([place])   ? dx 2002  ? Neuromuscular disorder ([place])   ? HAVE MS  ? Pneumonia   ? [id number]  ? ? ?Past Surgical History:  ?Procedure Laterality Date  ? BREAST LUMPECTOMY Left 08/2014  ? BREAST REDUCTION SURGERY Bilateral 08/2014  ? CESAREAN SECTION  2013  ? FLEXIBLE SIGMOIDOSCOPY N/A 05/31/2020  ? Procedure: FLEXIBLE SIGMOIDOSCOPY, ICG INTRAOP ASSESSMENT OF PERFUSION;  Surgeon: Ileana Roup, MD;  Location: [facility];  Service: General;  Laterality: N/A;  ? SIGMOIDECTOMY  05/31/2020  ? XI ROBOT ASSISTED SIGMOID COLECTOMY WITH COLORECTAL ANASTOMOSIS  ? VAGINA SURGERY    ? vagina tear from childbirth  ? WISDOM TOOTH EXTRACTION    ? ? ?Prior to Admission medications   ?Medication Sig Start Date End Date Taking? Authorizing Provider  ?albuterol (VENTOLIN HFA) 108 (90 Base) MCG/ACT inhaler 2 puffs every 6 (six) hours as needed for wheezing or shortness of breath. 06/12/21  Yes [provider]  ?cholecalciferol (VITAMIN D3) 25 MCG (1000 UNIT) tablet Take 1,000 Units by mouth daily.   Yes [provider]  ?ferrous sulfate 325 (65 FE) MG tablet Take 325 mg by mouth every other day.   Yes [provider]  ?fluticasone (FLONASE) 50 MCG/ACT nasal spray Place 2 sprays into both nostrils daily. ?Patient taking differently: Place 2 sprays into both nostrils daily as needed for allergies. 05/27/21 05/27/22 Yes Terrilyn Saver, NP   ?Multiple Vitamins-Minerals (MULTIVITAMIN WITH MINERALS) tablet Take 1 tablet by mouth daily. 06/06/21  Yes [name], NP  ? ? ?Current Facility-Administered Medications  ?Medication Dose Route Frequency Provider Last Rate Last Admin  ? lactated ringers infusion   Intravenous Continuous Gatha Mayer, MD 10 mL/hr at 09/09/21 0806 1,000 mL at 09/09/21 0806  ? ? ?Allergies as of 06/27/2021  ? (No Known Allergies)  ? ? ?Family History  ?Problem Relation Age of Onset  ? Colon polyps Mother   ? Thyroid disease Mother   ? Hypertension Father   ? Prostate cancer Father 52  ? Skin cancer Father 45  ? Parkinson's disease Paternal Grandmother   ? Breast cancer Other   ?     unconfirmed diagnosis, great-aunt (MGF's sister)  ? Colon cancer Neg Hx   ? Esophageal cancer Neg Hx   ? Stomach cancer Neg Hx   ? Rectal cancer Neg Hx   ? Crohn's disease Neg Hx   ? ? ?Social History  ? ?Socioeconomic History  ? Marital status: Married  ?  Spouse name: Not on file  ? Number of children: Not on file  ? Years of education: Not on file  ? Highest education level: Not on file  ?Occupational History  ? Occupation: [employer] features editor  ?Tobacco Use  ? Smoking status: Former  ?  Packs/day: 1.00  ?  Years: 5.00  ?  Pack years: 5.00  ?  Types: Cigarettes  ?  Quit date: 04/29/2003  ?  Years since quitting: 18.3  ?  Passive exposure: Never  ? Smokeless tobacco: Never  ?Vaping Use  ? Vaping Use: Never used  ?Substance and Sexual Activity  ? Alcohol use: Yes  ?  Alcohol/week: 1.0 standard drink  ?  Types: 1 Standard drinks or equivalent per week  ?  Comment: once a wk  ? Drug use: Never  ? Sexual activity: Yes  ?  Partners: Male  ?Other Topics Concern  ? Not on file  ?Social History Narrative  ? Works as an Geologist, engineering  ? Married-  Children 2013 son and 2011 daughter  ? Moved from [facility]  ? Enjoys children's sports  ? Enjoys outside activities  ? dog  ? Complete bachelors degree  ?   ? Right handed  ? ? ?Review of Systems: ? ?All other review of systems  negative except as mentioned in the HPI. ? ?Physical Exam: ?Vital signs ?BP 131/78   Pulse 76   Temp (!) 97.5 ?F (36.4 ?C) (Temporal)   Resp 20   Ht '5\' 4"'$  (1.626 m)   Wt (!) 139.3 kg   LMP 09/09/2021   SpO2 100%   BMI 52.70 kg/m?  ? ?General:   Alert,  Well-developed, well-nourished, pleasant and cooperative in NAD ?Lungs:  Clear throughout to auscultation.   ?Heart:  Regular rate and rhythm; no murmurs, clicks, rubs,  or gallops. ?Abdomen:  Soft, nontender and nondistended. Normal bowel sounds.   ?Neuro/Psych:  Alert and cooperative. Normal mood and affect. A and O x 3 ? ? ?'@Kennett Symes'$  Simonne Maffucci, MD, Marval Regal ?[facility] ?[phone] (pager) ?09/09/2021 8:40 AM@ ? ?

## 2021-09-09 NOTE — Discharge Instructions (Signed)
Great news - no polyps or cancer seen! ? ?Your next routine colonoscopy should be in 3 years - 2026. ? ?YOU HAD AN ENDOSCOPIC PROCEDURE TODAY: Refer to the procedure report and other information in the discharge instructions given to you for any specific questions about what was found during the examination. If this information does not answer your questions, please call Dr. Celesta Aver office at [phone] to clarify.  ? ?YOU SHOULD EXPECT: Some feelings of bloating in the abdomen. Passage of more gas than usual. Walking can help get rid of the air that was put into your GI tract during the procedure and reduce the bloating. If you had a lower endoscopy (such as a colonoscopy or flexible sigmoidoscopy) you may notice spotting of blood in your stool or on the toilet paper. Some abdominal soreness may be present for a day or two, also. ? ?DIET: Your first meal following the procedure should be a light meal and then it is ok to progress to your normal diet. A half-sandwich or bowl of soup is an example of a good first meal. Heavy or fried foods are harder to digest and may make you feel nauseous or bloated. Drink plenty of fluids but you should avoid alcoholic beverages for 24 hours.  ? ?ACTIVITY: Your care partner should take you home directly after the procedure. You should plan to take it easy, moving slowly for the rest of the day. You can resume normal activity the day after the procedure however YOU SHOULD NOT DRIVE, use power tools, machinery or perform tasks that involve climbing or major physical exertion for 24 hours (because of the sedation medicines used during the test).  ? ?SYMPTOMS TO REPORT IMMEDIATELY: ?A gastroenterologist can be reached at any hour. Please call [phone]  for any of the following symptoms:  ?Following lower endoscopy (colonoscopy, flexible sigmoidoscopy) ?Excessive amounts of blood in the stool  ?Significant tenderness, worsening of abdominal pains  ?Swelling of the abdomen that  is new, acute  ?Fever of 100? or higher  ?Following upper endoscopy (EGD, EUS, ERCP, esophageal dilation) ?Vomiting of blood or coffee ground material  ?New, significant abdominal pain  ?New, significant chest pain or pain under the shoulder blades  ?Painful or persistently difficult swallowing  ?New shortness of breath  ?Black, tarry-looking or red, bloody stools ? ?FOLLOW UP:  ?If any biopsies were taken you will be contacted by phone or by letter within the next 1-3 weeks. Call [phone]  if you have not heard about the biopsies in 3 weeks.  ?Please also call with any specific questions about appointments or follow up tests. ? ? ?

## 2021-09-09 NOTE — Transfer of Care (Signed)
Immediate Anesthesia Transfer of Care Note ? ?Patient: [name] ? ?Procedure(s) Performed: COLONOSCOPY WITH PROPOFOL ? ?Patient Location: PACU ? ?Anesthesia Type:MAC ? ?Level of Consciousness: awake, alert  and oriented ? ?Airway & Oxygen Therapy: Patient Spontanous Breathing and Patient connected to face mask oxygen ? ?Post-op Assessment: Report given to RN, Post -op Vital signs reviewed and stable and Patient moving all extremities X 4 ? ?Post vital signs: Reviewed and stable ? ?Last Vitals:  ?Vitals Value Taken Time  ?BP 156/116   ?Temp    ?Pulse 67   ?Resp 15   ?SpO2 100   ? ? ?Last Pain:  ?Vitals:  ? 09/09/21 0800  ?TempSrc: Temporal  ?PainSc: 0-No pain  ?   ? ?  ? ?Complications: No notable events documented. ?

## 2021-09-10 ENCOUNTER — Encounter (HOSPITAL_COMMUNITY): Payer: Self-pay | Admitting: Internal Medicine

## 2022-01-01 ENCOUNTER — Encounter (INDEPENDENT_AMBULATORY_CARE_PROVIDER_SITE_OTHER): Payer: BC Managed Care – PPO | Admitting: Family

## 2022-01-01 DIAGNOSIS — U071 COVID-19: Secondary | ICD-10-CM | POA: Insufficient documentation

## 2022-01-01 MED ORDER — NIRMATRELVIR/RITONAVIR (PAXLOVID)TABLET: 3.0000 | ORAL_TABLET | Freq: Two times a day (BID) | ORAL | 0 refills | Status: AC

## 2022-01-01 NOTE — Telephone Encounter (Signed)
Please see the MyChart message reply(ies) for my assessment and plan.  The patient gave consent for this Medical Advice Message and is aware that it may result in a bill to their insurance company as well as the possibility that this may result in a co-payment or deductible. They are an established patient, but are not seeking medical advice exclusively about a problem treated during an in person or video visit in the last 7 days. I did not recommend an in person or video visit within 7 days of my reply.  I spent a total of 5 minutes of cumulative provider time within 7 days through [place], NP

## 2022-01-09 DIAGNOSIS — Z85038 Personal history of other malignant neoplasm of large intestine: Secondary | ICD-10-CM | POA: Diagnosis not present

## 2022-01-09 DIAGNOSIS — Z87891 Personal history of nicotine dependence: Secondary | ICD-10-CM | POA: Diagnosis not present

## 2022-01-09 DIAGNOSIS — Z08 Encounter for follow-up examination after completed treatment for malignant neoplasm: Secondary | ICD-10-CM | POA: Diagnosis not present

## 2022-01-09 DIAGNOSIS — Z853 Personal history of malignant neoplasm of breast: Secondary | ICD-10-CM | POA: Diagnosis not present

## 2022-01-09 DIAGNOSIS — Z86000 Personal history of in-situ neoplasm of breast: Secondary | ICD-10-CM | POA: Diagnosis not present

## 2022-01-09 DIAGNOSIS — D0512 Intraductal carcinoma in situ of left breast: Secondary | ICD-10-CM | POA: Diagnosis not present

## 2022-02-12 ENCOUNTER — Ambulatory Visit (INDEPENDENT_AMBULATORY_CARE_PROVIDER_SITE_OTHER): Payer: BC Managed Care – PPO | Admitting: Family

## 2022-02-12 ENCOUNTER — Other Ambulatory Visit (HOSPITAL_COMMUNITY)
Admission: RE | Admit: 2022-02-12 | Discharge: 2022-02-12 | Disposition: A | Discharge status: Home / Self Care | Payer: BC Managed Care – PPO | Source: Ambulatory Visit | Attending: Family | Admitting: Family

## 2022-02-12 ENCOUNTER — Encounter: Payer: Self-pay | Admitting: Family

## 2022-02-12 ENCOUNTER — Telehealth: Payer: Self-pay | Admitting: Family

## 2022-02-12 VITALS — BP 104/64 | HR 72 | Resp 18 | Ht 64.0 in | Wt 318.2 lb

## 2022-02-12 DIAGNOSIS — R7989 Other specified abnormal findings of blood chemistry: Secondary | ICD-10-CM

## 2022-02-12 DIAGNOSIS — E785 Hyperlipidemia, unspecified: Secondary | ICD-10-CM | POA: Diagnosis not present

## 2022-02-12 DIAGNOSIS — Z0000 Encounter for general adult medical examination without abnormal findings: Secondary | ICD-10-CM | POA: Diagnosis not present

## 2022-02-12 DIAGNOSIS — R635 Abnormal weight gain: Secondary | ICD-10-CM | POA: Diagnosis not present

## 2022-02-12 DIAGNOSIS — Z01419 Encounter for gynecological examination (general) (routine) without abnormal findings: Secondary | ICD-10-CM

## 2022-02-12 DIAGNOSIS — E611 Iron deficiency: Secondary | ICD-10-CM

## 2022-02-12 DIAGNOSIS — G35 Multiple sclerosis: Secondary | ICD-10-CM

## 2022-02-12 LAB — COMPREHENSIVE METABOLIC PANEL
ALT: 9 U/L (ref 0–35)
AST: 11 U/L (ref 0–37)
Albumin: 3.5 g/dL (ref 3.5–5.2)
Alkaline Phosphatase: 96 U/L (ref 39–117)
BUN: 10 mg/dL (ref 6–23)
CO2: 31 mEq/L (ref 19–32)
Calcium: 8.5 mg/dL (ref 8.4–10.5)
Chloride: 101 mEq/L (ref 96–112)
Creatinine, Ser: 0.55 mg/dL (ref 0.40–1.20)
GFR: 108.84 mL/min (ref >60.00)
Glucose, Bld: 105 mg/dL — ABNORMAL HIGH (ref 70–99)
Potassium: 3.5 mEq/L (ref 3.5–5.1)
Sodium: 139 mEq/L (ref 135–145)
Total Bilirubin: 0.4 mg/dL (ref 0.2–1.2)
Total Protein: 6.5 g/dL (ref 6.0–8.3)

## 2022-02-12 LAB — CBC WITH DIFFERENTIAL/PLATELET
Basophils Absolute: 0.1 K/uL (ref 0.0–0.1)
Basophils Relative: 0.9 % (ref 0.0–3.0)
Eosinophils Absolute: 0.2 K/uL (ref 0.0–0.7)
Eosinophils Relative: 2.5 % (ref 0.0–5.0)
HCT: 38.7 % (ref 36.0–46.0)
Hemoglobin: 12.5 g/dL (ref 12.0–15.0)
Lymphocytes Relative: 23.5 % (ref 12.0–46.0)
Lymphs Abs: 1.7 K/uL (ref 0.7–4.0)
MCHC: 32.4 g/dL (ref 30.0–36.0)
MCV: 88.9 fl (ref 78.0–100.0)
Monocytes Absolute: 0.5 K/uL (ref 0.1–1.0)
Monocytes Relative: 6.5 % (ref 3.0–12.0)
Neutro Abs: 4.7 K/uL (ref 1.4–7.7)
Neutrophils Relative %: 66.6 % (ref 43.0–77.0)
Platelets: 285 K/uL (ref 150.0–400.0)
RBC: 4.35 Mil/uL (ref 3.87–5.11)
RDW: 14 % (ref 11.5–15.5)
WBC: 7 K/uL (ref 4.0–10.5)

## 2022-02-12 LAB — LIPID PANEL
Cholesterol: 208 mg/dL — ABNORMAL HIGH (ref 0–200)
HDL: 56.1 mg/dL
LDL Cholesterol: 123 mg/dL — ABNORMAL HIGH (ref 0–99)
NonHDL: 152.27
Total CHOL/HDL Ratio: 4
Triglycerides: 147 mg/dL (ref 0.0–149.0)
VLDL: 29.4 mg/dL (ref 0.0–40.0)

## 2022-02-12 LAB — IRON: Iron: 90 ug/dL (ref 42–145)

## 2022-02-12 LAB — TSH: TSH: 1.95 u[IU]/mL (ref 0.35–5.50)

## 2022-02-12 LAB — VITAMIN B12: Vitamin B-12: 204 pg/mL — ABNORMAL LOW (ref 211–911)

## 2022-02-12 NOTE — Assessment & Plan Note (Addendum)
Has not seen neuro since 2020, will refer back to Dr. Tomi Likens. MS is clinically stable. Pap performed.

## 2022-02-12 NOTE — Assessment & Plan Note (Addendum)
.   Wt Readings from Last 3 Encounters:  02/12/22 (!) 318 lb 3.2 oz (144.3 kg)  09/09/21 (!) 307 lb (139.3 kg)  08/12/21 (!) 308 lb (139.7 kg)   Discussed diet/exercise/weight loss. Flu and tetanus up to date. Pap today.  Declines covid booster.

## 2022-02-12 NOTE — Telephone Encounter (Signed)
See mychart.  

## 2022-02-12 NOTE — Progress Notes (Signed)
Subjective:   By signing my name below, I, Elizabeth Dixon, attest that this documentation has been prepared under the direction and in the presence of Elizabeth Dixon, 02/12/2022.   Patient ID: Elizabeth Dixon, female    DOB: 01-15-1976, 46 y.o.   MRN: [id number]  Chief Complaint  Patient presents with   Annual Exam    Doing well , no complaints    HPI Patient is in today for a comprehensive physical exam.  She denies new moles, itching, chills, fever,hearing loss, sinus pain, congestion, sore throat, cough and hemoptysis, chest pain, palpitations, wheezing, constipation, diarrhea, blood in stool, nausea and vomiting, dysuria, frequency, hematuria, myalgias and joint pain, depression, anxiety.  Headache- Patient reports of experiencing occasional headaches in the back of her head which she attributes to not wearing her glasses.  Family history-No new surgical procedures and no changes in family medical history  Social history- Patient has 1 drink per week, doesn't vape or smoke, and doesn't take drugs. Immunizations- Patient is UTD on vaccines. Pap smear- last completed on 02/01/2019. Mammogram- last completed on 01/03/2021. Diet- She does not maintain a well balanced diet. Exercise- She does not participate in regular exercise. Vision-She reports she has an appointment in November 2023. Dental- She is UTD on dental care.  Past Medical History:  Diagnosis Date   Anemia    Breast cancer ([place]) 2016   left breast- ductal carcinoma in situ   Family history of breast cancer    Family history of prostate cancer    Hyperlipidemia    hx of   Iron deficiency anemia    hx of   Multiple sclerosis ([place])    dx 2002   Neuromuscular disorder ([place])    HAVE MS   Pneumonia    FEB,2023    Past Surgical History:  Procedure Laterality Date   BREAST LUMPECTOMY Left 08/2014   BREAST REDUCTION SURGERY Bilateral 08/2014   CESAREAN SECTION  2013   COLONOSCOPY WITH PROPOFOL N/A  09/09/2021   Procedure: COLONOSCOPY WITH PROPOFOL;  Surgeon: Gatha Mayer, MD;  Location: [facility];  Service: Gastroenterology;  Laterality: N/A;   FLEXIBLE SIGMOIDOSCOPY N/A 05/31/2020   Procedure: FLEXIBLE SIGMOIDOSCOPY, ICG INTRAOP ASSESSMENT OF PERFUSION;  Surgeon: Ileana Roup, MD;  Location: [facility];  Service: General;  Laterality: N/A;   SIGMOIDECTOMY  05/31/2020   XI ROBOT ASSISTED SIGMOID COLECTOMY WITH COLORECTAL ANASTOMOSIS   VAGINA SURGERY     vagina tear from childbirth   WISDOM TOOTH EXTRACTION      Family History  Problem Relation Age of Onset   Colon polyps Mother    Thyroid disease Mother    Hypertension Father    Prostate cancer Father 82   Skin cancer Father 73   Parkinson's disease Paternal Grandmother    Breast cancer Other        unconfirmed diagnosis, great-aunt (MGF's sister)   Colon cancer Neg Hx    Esophageal cancer Neg Hx    Stomach cancer Neg Hx    Rectal cancer Neg Hx    Crohn's disease Neg Hx     Social History   Socioeconomic History   Marital status: Married    Spouse name: Not on file   Number of children: Not on file   Years of education: Not on file   Highest education level: Not on file  Occupational History   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  Tobacco Use   Smoking status: Former    Packs/day: 1.00  Years: 5.00    Total pack years: 5.00    Types: Cigarettes    Quit date: 04/29/2003    Years since quitting: 18.8    Passive exposure: Never   Smokeless tobacco: Never  Vaping Use   Vaping Use: Never used  Substance and Sexual Activity   Alcohol use: Yes    Alcohol/week: 1.0 standard drink of alcohol    Types: 1 Standard drinks or equivalent per week    Comment: once a wk   Drug use: Never   Sexual activity: Yes    Partners: Male  Other Topics Concern   Not on file  Social History Narrative   Works as an Geologist, engineering   Married-  Children 2013 son and 2011 daughter   Moved from [place]   Enjoys children's sports   Enjoys  outside activities   dog   Complete bachelors degree      Right handed   Social Determinants of Health   Financial Resource Strain: Low Risk  (02/01/2019)   Overall Financial Resource Strain (CARDIA)    Difficulty of Paying Living Expenses: Not hard at all  Food Insecurity: No Food Insecurity (02/01/2019)   Hunger Vital Sign    Worried About Running Out of Food in the Last Year: Never true    [place] in the Last Year: Never true  Transportation Needs: No Transportation Needs (02/01/2019)   PRAPARE - Hydrologist (Medical): No    Lack of Transportation (Non-Medical): No  Physical Activity: Unknown (02/01/2019)   Exercise Vital Sign    Days of Exercise per Week: 0 days    Minutes of Exercise per Session: Not on file  Stress: Not on file  Social Connections: Unknown (02/01/2019)   Social Connection and Isolation Panel [NHANES]    Frequency of Communication with Friends and Family: More than three times a week    Frequency of Social Gatherings with Friends and Family: Once a week    Attends Religious Services: More than 4 times per year    Active Member of [employer] or Organizations: Not on file    Attends Archivist Meetings: Not on file    Marital Status: Not on file  Intimate Partner Violence: Not At Risk (02/01/2019)   Humiliation, Afraid, Rape, and Kick questionnaire    Fear of Current or Ex-Partner: No    Emotionally Abused: No    Physically Abused: No    Sexually Abused: No    Outpatient Medications Prior to Visit  Medication Sig Dispense Refill   cholecalciferol (VITAMIN D3) 25 MCG (1000 UNIT) tablet Take 1,000 Units by mouth daily.     ferrous sulfate 325 (65 FE) MG tablet Take 325 mg by mouth every other day.     fluticasone (FLONASE) 50 MCG/ACT nasal spray Place 2 sprays into both nostrils daily. (Patient taking differently: Place 2 sprays into both nostrils daily as needed for allergies.) 1 g 0   Multiple Vitamins-Minerals  (MULTIVITAMIN WITH MINERALS) tablet Take 1 tablet by mouth daily.     albuterol (VENTOLIN HFA) 108 (90 Base) MCG/ACT inhaler 2 puffs every 6 (six) hours as needed for wheezing or shortness of breath.     No facility-administered medications prior to visit.    No Known Allergies  Review of Systems  Constitutional:  Negative for chills and fever.  HENT:  Negative for congestion, sinus pain and sore throat.   Respiratory:  Negative for cough, hemoptysis and wheezing.  Cardiovascular:  Negative for chest pain and palpitations.  Gastrointestinal:  Negative for blood in stool, constipation, diarrhea, nausea and vomiting.  Genitourinary:  Negative for dysuria, frequency and hematuria.  Musculoskeletal:  Negative for joint pain and myalgias.  Skin:  Negative for itching.       (-) new moles  Psychiatric/Behavioral:  Negative for depression. The patient is not nervous/anxious.        Objective:    Physical Exam Constitutional:      General: She is not in acute distress.    Appearance: Normal appearance. She is not ill-appearing.  HENT:     Head: Normocephalic and atraumatic.     Right Ear: External ear normal.     Left Ear: External ear normal.  Eyes:     Extraocular Movements: Extraocular movements intact.     Pupils: Pupils are equal, round, and reactive to light.  Cardiovascular:     Rate and Rhythm: Normal rate and regular rhythm.     Heart sounds: Normal heart sounds. No murmur heard.    No gallop.  Pulmonary:     Effort: Pulmonary effort is normal. No respiratory distress.     Breath sounds: Normal breath sounds. No wheezing or rales.  Abdominal:     General: Bowel sounds are normal. There is no distension.     Palpations: Abdomen is soft.     Tenderness: There is no abdominal tenderness. There is no guarding.  Musculoskeletal:     Comments: 5/5 upper and lower extremity strength   Skin:    General: Skin is warm and dry.  Neurological:     Mental Status: She is alert  and oriented to person, place, and time.     Deep Tendon Reflexes:     Reflex Scores:      Patellar reflexes are 2+ on the right side and 2+ on the left side. Psychiatric:        Mood and Affect: Mood normal.        Behavior: Behavior normal.        Judgment: Judgment normal.   Breasts: Examined lying and sitting.  Right: Without masses, retractions, discharge or axillary adenopathy.  Left: some tissue thickening and skin erythema secondary to radiation Inguinal/mons: Normal without inguinal adenopathy  External genitalia: Normal  BUS/Urethra/Skene's glands: Normal  Bladder: Normal  Vagina: Normal  Cervix: Normal  Uterus: normal in size, shape and contour. Midline and mobile  Adnexa/parametria:  Rt: Without masses or tenderness.  Lt: Without masses or tenderness.  Anus and perineum: Normal   BP 104/64   Pulse 72   Resp 18   Ht _0  (1.626 m)   Wt (!) 318 lb 3.2 oz (144.3 kg)   SpO2 98%   BMI 54.62 kg/m  Wt Readings from Last 3 Encounters:  02/12/22 (!) 318 lb 3.2 oz (144.3 kg)  09/09/21 (!) 307 lb (139.3 kg)  08/12/21 (!) 308 lb (139.7 kg)        Assessment & Plan:   Problem List Items Addressed This Visit       Unprioritized   Preventative health care - Primary    . Wt Readings from Last 3 Encounters:  02/12/22 (!) 318 lb 3.2 oz (144.3 kg)  09/09/21 (!) 307 lb (139.3 kg)  08/12/21 (!) 308 lb (139.7 kg)  Discussed diet/exercise/weight loss. Flu and tetanus up to date. Pap today.  Declines covid booster.       Relevant Orders   Comp Met (CMET)  Multiple sclerosis ([place])    Has not seen neuro since 2020, will refer back to Dr. Tomi Likens. MS is clinically stable. Pap performed.       Hyperlipidemia   Relevant Orders   Lipid panel   Other Visit Diagnoses     Weight gain       Relevant Orders   TSH   Iron deficiency       Relevant Orders   Iron   CBC with Differential/Platelet   Low vitamin B12 level       Relevant Orders   B12   Encounter for  routine gynecological examination with Papanicolaou smear of cervix       Relevant Orders   Cytology - PAP( [place])       No orders of the defined types were placed in this encounter.   Cordella Register, personally preformed the services described in this documentation.  All medical record entries made by the scribe were at my direction and in my presence.  I have reviewed the chart and discharge instructions (if applicable) and agree that the record reflects my personal performance and is accurate and complete. 02/12/2022.  Jana Half O'Sullivan,acting as a Education administrator for Nance Pear, NP.,have documented all relevant documentation on the behalf of Nance Pear, NP,as directed by  Nance Pear, NP while in the presence of Nance Pear, NP.    Nance Pear, NP

## 2022-02-12 NOTE — Patient Instructions (Signed)
Please complete lab work prior to leaving.   

## 2022-02-13 ENCOUNTER — Encounter: Payer: Self-pay | Admitting: Family

## 2022-02-13 ENCOUNTER — Telehealth: Payer: Self-pay | Admitting: Family

## 2022-02-13 DIAGNOSIS — E538 Deficiency of other specified B group vitamins: Secondary | ICD-10-CM

## 2022-02-13 LAB — CYTOLOGY - PAP
Adequacy: ABSENT
Comment: NEGATIVE
Diagnosis: NEGATIVE
High risk HPV: NEGATIVE

## 2022-02-13 NOTE — Telephone Encounter (Signed)
Lab work notes b12 deficiency- recommend that she begin b12 1068mg IM weekly x 4 then monthly. Repeat b12 level in 3 months, dx b12 deficiency.  Sugar and cholesterol are both very mildly elevated.  Please continue work on healthy diet and regular exercise.

## 2022-02-14 MED ORDER — CYANOCOBALAMIN 1000 MCG/ML IJ SOLN: INTRAMUSCULAR | 0 refills | Status: DC

## 2022-02-14 MED ORDER — "BD ECLIPSE NEEDLE 22G X 1-1/2"" MISC": 0 refills | Status: DC

## 2022-02-14 NOTE — Telephone Encounter (Signed)
Patient advised of results, provider's comments and to start B12 injections.   She will like to do IM inections at home for B12.  Please send rx [facility] [place] [address] (on file)

## 2022-02-14 NOTE — Addendum Note (Signed)
Addended by: [name] on: 02/14/2022 05:11 PM   Modules accepted: Orders

## 2022-02-14 NOTE — Telephone Encounter (Signed)
Called but no answer  Lvm for patient to call back.

## 2022-03-14 ENCOUNTER — Encounter: Payer: Self-pay | Admitting: Family

## 2022-03-14 DIAGNOSIS — L989 Disorder of the skin and subcutaneous tissue, unspecified: Secondary | ICD-10-CM

## 2022-04-15 DIAGNOSIS — L814 Other melanin hyperpigmentation: Secondary | ICD-10-CM | POA: Diagnosis not present

## 2022-04-15 DIAGNOSIS — L579 Skin changes due to chronic exposure to nonionizing radiation, unspecified: Secondary | ICD-10-CM | POA: Diagnosis not present

## 2022-04-15 DIAGNOSIS — L821 Other seborrheic keratosis: Secondary | ICD-10-CM | POA: Diagnosis not present

## 2023-01-15 DIAGNOSIS — R92323 Mammographic fibroglandular density, bilateral breasts: Secondary | ICD-10-CM | POA: Diagnosis not present

## 2023-01-15 DIAGNOSIS — Z86000 Personal history of in-situ neoplasm of breast: Secondary | ICD-10-CM | POA: Diagnosis not present

## 2023-01-15 DIAGNOSIS — Z1231 Encounter for screening mammogram for malignant neoplasm of breast: Secondary | ICD-10-CM | POA: Diagnosis not present

## 2023-01-15 LAB — HM MAMMOGRAPHY

## 2023-02-16 ENCOUNTER — Ambulatory Visit (INDEPENDENT_AMBULATORY_CARE_PROVIDER_SITE_OTHER): Payer: BC Managed Care – PPO | Admitting: Family

## 2023-02-16 ENCOUNTER — Encounter: Payer: Self-pay | Admitting: Family

## 2023-02-16 VITALS — BP 133/65 | HR 65 | Temp 98.4°F | Resp 16 | Ht 64.0 in | Wt 335.0 lb

## 2023-02-16 DIAGNOSIS — Z0000 Encounter for general adult medical examination without abnormal findings: Secondary | ICD-10-CM

## 2023-02-16 DIAGNOSIS — C187 Malignant neoplasm of sigmoid colon: Secondary | ICD-10-CM

## 2023-02-16 DIAGNOSIS — D509 Iron deficiency anemia, unspecified: Secondary | ICD-10-CM | POA: Diagnosis not present

## 2023-02-16 DIAGNOSIS — Z85038 Personal history of other malignant neoplasm of large intestine: Secondary | ICD-10-CM

## 2023-02-16 DIAGNOSIS — E538 Deficiency of other specified B group vitamins: Secondary | ICD-10-CM | POA: Diagnosis not present

## 2023-02-16 DIAGNOSIS — G35 Multiple sclerosis: Secondary | ICD-10-CM | POA: Diagnosis not present

## 2023-02-16 DIAGNOSIS — E785 Hyperlipidemia, unspecified: Secondary | ICD-10-CM | POA: Diagnosis not present

## 2023-02-16 DIAGNOSIS — R739 Hyperglycemia, unspecified: Secondary | ICD-10-CM | POA: Diagnosis not present

## 2023-02-16 DIAGNOSIS — R7989 Other specified abnormal findings of blood chemistry: Secondary | ICD-10-CM | POA: Diagnosis not present

## 2023-02-16 DIAGNOSIS — D0512 Intraductal carcinoma in situ of left breast: Secondary | ICD-10-CM

## 2023-02-16 LAB — LIPID PANEL
Cholesterol: 203 mg/dL — ABNORMAL HIGH (ref 0–200)
HDL: 55.5 mg/dL (ref >39.00)
LDL Cholesterol: 116 mg/dL — ABNORMAL HIGH (ref 0–99)
NonHDL: 147.28
Total CHOL/HDL Ratio: 4
Triglycerides: 156 mg/dL — ABNORMAL HIGH (ref 0.0–149.0)
VLDL: 31.2 mg/dL (ref 0.0–40.0)

## 2023-02-16 LAB — CBC WITH DIFFERENTIAL/PLATELET
Basophils Absolute: 0 10*3/uL (ref 0.0–0.1)
Basophils Relative: 0.5 % (ref 0.0–3.0)
Eosinophils Absolute: 0.2 10*3/uL (ref 0.0–0.7)
Eosinophils Relative: 3 % (ref 0.0–5.0)
HCT: 37.9 % (ref 36.0–46.0)
Hemoglobin: 12 g/dL (ref 12.0–15.0)
Lymphocytes Relative: 26.8 % (ref 12.0–46.0)
Lymphs Abs: 1.8 10*3/uL (ref 0.7–4.0)
MCHC: 31.7 g/dL (ref 30.0–36.0)
MCV: 87.6 fL (ref 78.0–100.0)
Monocytes Absolute: 0.5 10*3/uL (ref 0.1–1.0)
Monocytes Relative: 7.2 % (ref 3.0–12.0)
Neutro Abs: 4.1 10*3/uL (ref 1.4–7.7)
Neutrophils Relative %: 62.5 % (ref 43.0–77.0)
Platelets: 299 10*3/uL (ref 150.0–400.0)
RBC: 4.33 Mil/uL (ref 3.87–5.11)
RDW: 14.7 % (ref 11.5–15.5)
WBC: 6.6 10*3/uL (ref 4.0–10.5)

## 2023-02-16 LAB — COMPREHENSIVE METABOLIC PANEL
ALT: 10 U/L (ref 0–35)
AST: 12 U/L (ref 0–37)
Albumin: 3.4 g/dL — ABNORMAL LOW (ref 3.5–5.2)
Alkaline Phosphatase: 82 U/L (ref 39–117)
BUN: 12 mg/dL (ref 6–23)
CO2: 29 meq/L (ref 19–32)
Calcium: 8.6 mg/dL (ref 8.4–10.5)
Chloride: 103 meq/L (ref 96–112)
Creatinine, Ser: 0.57 mg/dL (ref 0.40–1.20)
GFR: 107.14 mL/min (ref >60.00)
Glucose, Bld: 96 mg/dL (ref 70–99)
Potassium: 3.9 meq/L (ref 3.5–5.1)
Sodium: 140 meq/L (ref 135–145)
Total Bilirubin: 0.3 mg/dL (ref 0.2–1.2)
Total Protein: 6.3 g/dL (ref 6.0–8.3)

## 2023-02-16 LAB — VITAMIN B12: Vitamin B-12: 253 pg/mL (ref 211–911)

## 2023-02-16 LAB — HEMOGLOBIN A1C: Hgb A1c MFr Bld: 5.6 % (ref 4.6–6.5)

## 2023-02-16 LAB — VITAMIN D 25 HYDROXY (VIT D DEFICIENCY, FRACTURES): VITD: 30.44 ng/mL (ref 30.00–100.00)

## 2023-02-16 NOTE — Assessment & Plan Note (Signed)
Mammo up to date. Continues annual follow up with oncology survivorship clinic.

## 2023-02-16 NOTE — Progress Notes (Signed)
Subjective:     Patient ID: Elizabeth Dixon, female    DOB: Oct 18, 1975, 47 y.o.   MRN: [id number]  Chief Complaint  Patient presents with   Annual Exam    HPI  Discussed the use of AI scribe software for clinical note transcription with the patient, who gave verbal consent to proceed.  History of Present Illness   The patient presents for an annual physical. She reports no current cough, cold symptoms, skin issues, urinary issues, or unusual muscle or joint pain. She denies frequent headaches and has no current concerns about depression or anxiety. She has not been taking her vitamin D or B12 supplements regularly. She reports periods are heavy and irregular, with some months experiencing seven days of heavy flow with clots, and other months having shorter periods or none at all. She has not been experiencing hot flashes or night sweats.  The patient has a history of multiple sclerosis (MS) but has not been working with neurology and has not had any issues. She was diagnosed in 2002. She also reports that her father has early stages of dementia.  The patient has been trying to maintain a healthy diet and has recently started a low carb diet with her family. She does not exercise outside of her normal work activities. She had a mammogram last month which was normal. Her last colonoscopy was last year and is due again in January 2026. Her last pap smear is good until 2028. She is up to date on her vision and dental check-ups.     Immunizations: flu shot up to date Diet: she is starting to work on a low carb diet with her family Wt Readings from Last 3 Encounters:  02/16/23 (!) 335 lb (152 kg)  02/12/22 (!) 318 lb 3.2 oz (144.3 kg)  09/09/21 (!) 307 lb (139.3 kg)  Exercise: no formal.  Colonoscopy: due 1/26 Pap Smear: 10/28 due Mammogram:up to date Vision: up to date Dental: up to date     Health Maintenance Due  Topic Date Due   COVID-19 Vaccine (2 - Janssen risk series) 07/29/2019     Past Medical History:  Diagnosis Date   Anemia    B12 deficiency    Breast cancer ([facility]) 2016   left breast- ductal carcinoma in situ   Family history of breast cancer    Family history of prostate cancer    Hyperlipidemia    hx of   Iron deficiency anemia    hx of   Multiple sclerosis ([facility])    dx 2002   Neuromuscular disorder ([facility])    HAVE MS   Pleural effusion 06/21/2021   Pneumonia    FEB,2023   Pneumonia of left lower lobe due to infectious organism 06/05/2021    Past Surgical History:  Procedure Laterality Date   BREAST LUMPECTOMY Left 08/2014   BREAST REDUCTION SURGERY Bilateral 08/2014   CESAREAN SECTION  2013   COLONOSCOPY WITH PROPOFOL N/A 09/09/2021   Procedure: COLONOSCOPY WITH PROPOFOL;  Surgeon: Iva Boop, MD;  Location: [facility];  Service: Gastroenterology;  Laterality: N/A;   FLEXIBLE SIGMOIDOSCOPY N/A 05/31/2020   Procedure: FLEXIBLE SIGMOIDOSCOPY, ICG INTRAOP ASSESSMENT OF PERFUSION;  Surgeon: Andria Meuse, MD;  Location: [facility];  Service: General;  Laterality: N/A;   SIGMOIDECTOMY  05/31/2020   XI ROBOT ASSISTED SIGMOID COLECTOMY WITH COLORECTAL ANASTOMOSIS   VAGINA SURGERY     vagina tear from childbirth   WISDOM TOOTH EXTRACTION      Family  History  Problem Relation Age of Onset   Colon polyps Mother    Thyroid disease Mother    Hypertension Father    Prostate cancer Father 49   Skin cancer Father 28   Dementia Father    Parkinson's disease Paternal Grandmother    Breast cancer Other        unconfirmed diagnosis, great-aunt (MGF's sister)   Colon cancer Neg Hx    Esophageal cancer Neg Hx    Stomach cancer Neg Hx    Rectal cancer Neg Hx    Crohn's disease Neg Hx     Social History   Socioeconomic History   Marital status: Married    Spouse name: Not on file   Number of children: Not on file   Years of education: Not on file   Highest education level: Not on file  Occupational History   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   Tobacco Use   Smoking status: Former    Current packs/day: 0.00    Average packs/day: 1 pack/day for 5.0 years (5.0 ttl pk-yrs)    Types: Cigarettes    Start date: 04/28/1998    Quit date: 04/29/2003    Years since quitting: 19.8    Passive exposure: Never   Smokeless tobacco: Never  Vaping Use   Vaping status: Never Used  Substance and Sexual Activity   Alcohol use: Yes    Alcohol/week: 1.0 standard drink of alcohol    Types: 1 Standard drinks or equivalent per week    Comment: once a wk   Drug use: Never   Sexual activity: Yes    Partners: Male    Birth control/protection: Surgical    Comment: husband had vasectomy  Other Topics Concern   Not on file  Social History Narrative   Works as an Publishing rights manager   Married-  Children 2013 son and 2011 daughter   Moved from [place]   Enjoys children's sports   Enjoys outside activities   dog   Complete bachelors degree      Right handed   Social Determinants of Health   Financial Resource Strain: Low Risk  (02/01/2019)   Overall Financial Resource Strain (CARDIA)    Difficulty of Paying Living Expenses: Not hard at all  Food Insecurity: No Food Insecurity (02/01/2019)   Hunger Vital Sign    Worried About Running Out of Food in the Last Year: Never true    Ran Out of Food in the Last Year: Never true  Transportation Needs: No Transportation Needs (02/01/2019)   PRAPARE - Administrator, Civil Service (Medical): No    Lack of Transportation (Non-Medical): No  Physical Activity: Unknown (02/01/2019)   Exercise Vital Sign    Days of Exercise per Week: 0 days    Minutes of Exercise per Session: Not on file  Stress: Not on file  Social Connections: Unknown (02/01/2019)   Social Connection and Isolation Panel [NHANES]    Frequency of Communication with Friends and Family: More than three times a week    Frequency of Social Gatherings with Friends and Family: Once a week    Attends Religious Services: More than 4 times per year     Active Member of [employer] or Organizations: Not on file    Attends Banker Meetings: Not on file    Marital Status: Not on file  Intimate Partner Violence: Not At Risk (02/01/2019)   Humiliation, Afraid, Rape, and Kick questionnaire    Fear of Current or  Ex-Partner: No    Emotionally Abused: No    Physically Abused: No    Sexually Abused: No    Outpatient Medications Prior to Visit  Medication Sig Dispense Refill   ferrous sulfate 325 (65 FE) MG tablet Take 325 mg by mouth every other day.     Multiple Vitamins-Minerals (MULTIVITAMIN WITH MINERALS) tablet Take 1 tablet by mouth daily.     fluticasone (FLONASE) 50 MCG/ACT nasal spray Place 2 sprays into both nostrils daily. (Patient taking differently: Place 2 sprays into both nostrils daily as needed for allergies.) 1 g 0   cholecalciferol (VITAMIN D3) 25 MCG (1000 UNIT) tablet Take 1,000 Units by mouth daily.     cyanocobalamin (VITAMIN B12) 1000 MCG/ML injection Inject IM weekly x 4 weeks then once monthly 12 mL 0   NEEDLE, DISP, 22 G (BD ECLIPSE NEEDLE) 22G X 1-1/2" MISC Use as directed 12 each 0   No facility-administered medications prior to visit.    No Known Allergies  Review of Systems  Constitutional:  Negative for weight loss.  HENT:  Negative for congestion and hearing loss.   Eyes:  Negative for blurred vision.  Respiratory:  Negative for cough.   Cardiovascular:  Negative for leg swelling.  Gastrointestinal:  Negative for constipation and diarrhea.  Genitourinary:  Negative for dysuria and frequency.  Musculoskeletal:  Negative for joint pain and myalgias.  Skin:  Negative for rash.  Neurological:  Negative for sensory change and headaches.  Psychiatric/Behavioral:         Denies depression/anxiety       Objective:    Physical Exam   BP 133/65 (BP Location: Right Arm, Patient Position: Sitting, Cuff Size: Large)   Pulse 65   Temp 98.4 F (36.9 C) (Oral)   Resp 16   Ht 5\' 4"  (1.626 m)    Wt (!) 335 lb (152 kg)   SpO2 99%   BMI 57.50 kg/m  Wt Readings from Last 3 Encounters:  02/16/23 (!) 335 lb (152 kg)  02/12/22 (!) 318 lb 3.2 oz (144.3 kg)  09/09/21 (!) 307 lb (139.3 kg)  Physical Exam  Constitutional: She is oriented to person, place, and time. She appears well-developed and well-nourished. No distress.  HENT:  Head: Normocephalic and atraumatic.  Right Ear: Tympanic membrane and ear canal normal.  Left Ear: Tympanic membrane and ear canal normal.  Mouth/Throat: Oropharynx is clear and moist.  Eyes: Pupils are equal, round, and reactive to light. No scleral icterus.  Neck: Normal range of motion. No thyromegaly present.  Cardiovascular: Normal rate and regular rhythm.   No murmur heard. Pulmonary/Chest: Effort normal and breath sounds normal. No respiratory distress. He has no wheezes. She has no rales. She exhibits no tenderness.  Abdominal: Soft. Bowel sounds are normal. She exhibits no distension and no mass. There is no tenderness. There is no rebound and no guarding.  Musculoskeletal: She exhibits no edema.  Lymphadenopathy:    She has no cervical adenopathy.  Neurological: She is alert and oriented to person, place, and time.  She exhibits normal muscle tone. Coordination normal.  Skin: Skin is warm and dry.  Psychiatric: She has a normal mood and affect. Her behavior is normal. Judgment and thought content normal.  Breast/pelvic: deferred        Assessment & Plan:        Assessment & Plan:   Problem List Items Addressed This Visit       Unprioritized   Preventative health care -  Primary     -Recent normal mammogram at [facility]. -Colonoscopy due January 2026. -Pap smear due 2028. -Encouraged COVID booster vaccination. -Follow-up in 6 months.      Personal history of colon cancer    Colonoscopy due 1/26.      Multiple sclerosis ([facility])    Clinically stable. She has not sought treatment since 2020.  I encouraged her to  establish with neurology for surveillance.       Relevant Orders   Ambulatory referral to Neurology   Low vitamin D level    Not taking vit D regularly. Update level.      Relevant Orders   Vitamin D (25 hydroxy)   Hyperlipidemia    Lab Results  Component Value Date   CHOL 208 (H) 02/12/2022   HDL 56.10 02/12/2022   LDLCALC 123 (H) 02/12/2022   TRIG 147.0 02/12/2022   CHOLHDL 4 02/12/2022         Relevant Orders   Comp Met (CMET)   Lipid panel   Hyperglycemia    Lab Results  Component Value Date   HGBA1C 5.7 06/05/2021         Relevant Orders   HgB A1c   Ductal carcinoma in situ of left breast    Mammo up to date. Continues annual follow up with oncology survivorship clinic.      Cancer of sigmoid colon ([facility]CC)    Due for colo1/26. Follows with Dr. Leone [facility] at [facility].       B12 deficiency   Relevant Orders   B12   Anemia    Admits that she has not bee taking her vitamins recently.  Will update CBC and iron studies.      Relevant Orders   CBC w/Diff   Iron, TIBC and Ferritin Panel    I have discontinued Tynisha Kole's cholecalciferol, cyanocobalamin, and BD Eclipse Needle. I am also having her maintain her fluticasone, multivitamin with minerals, and ferrous sulfate.  No orders of the defined types were placed in this encounter.

## 2023-02-16 NOTE — Assessment & Plan Note (Signed)
Not taking vit D regularly. Update level.

## 2023-02-16 NOTE — Assessment & Plan Note (Signed)
Lab Results  Component Value Date   HGBA1C 5.7 06/05/2021

## 2023-02-16 NOTE — Patient Instructions (Signed)
VISIT SUMMARY:  During your annual physical, we discussed your overall health and specific concerns. You reported no current illnesses or symptoms, but mentioned irregular and heavy periods. We also discussed your history of multiple sclerosis (MS), which has been stable, and your father's early stages of dementia. You've been trying to maintain a healthy diet and recently started a low carb diet. Your recent mammogram was normal, and you're up to date on your vision and dental check-ups.  YOUR PLAN:  -MULTIPLE SCLEROSIS: Even though you haven't had any recent issues with your MS, it's important to regularly check in with a neurologist. We'll refer you to a neurologist for a check-up.  -PERIMENOPAUSE: Your irregular, heavy periods with clotting are normal symptoms of perimenopause. We'll check your iron levels to make sure the heavy periods aren't causing anemia.  -HYPERLIPIDEMIA: Your cholesterol levels were slightly elevated last year and your weight has increased since your last visit. We'll check your lipid panel and A1C, and encourage you to continue your low carb diet and add regular exercise to your routine.  INSTRUCTIONS:  Please consider getting your COVID booster vaccination. Your next colonoscopy is due in January 2026, and your next pap smear is due in 2028. We'll see you again for a follow-up in 6 months.

## 2023-02-16 NOTE — Assessment & Plan Note (Addendum)
Clinically stable. She has not sought treatment since 2020.  I encouraged her to establish with neurology for surveillance.

## 2023-02-16 NOTE — Assessment & Plan Note (Signed)
Due for colo1/26. Follows with Dr. Leone Payor at [facility].

## 2023-02-16 NOTE — Assessment & Plan Note (Signed)
-  Recent normal mammogram at [facility]. -Colonoscopy due January 2026. -Pap smear due 2028. -Encouraged COVID booster vaccination. -Follow-up in 6 months.

## 2023-02-16 NOTE — Assessment & Plan Note (Signed)
Lab Results  Component Value Date   CHOL 208 (H) 02/12/2022   HDL 56.10 02/12/2022   LDLCALC 123 (H) 02/12/2022   TRIG 147.0 02/12/2022   CHOLHDL 4 02/12/2022

## 2023-02-16 NOTE — Assessment & Plan Note (Signed)
Admits that she has not bee taking her vitamins recently.  Will update CBC and iron studies.

## 2023-02-16 NOTE — Assessment & Plan Note (Signed)
Colonoscopy due 1/26.

## 2023-02-17 ENCOUNTER — Telehealth: Payer: Self-pay | Admitting: Family

## 2023-02-17 DIAGNOSIS — E538 Deficiency of other specified B group vitamins: Secondary | ICD-10-CM

## 2023-02-17 DIAGNOSIS — E611 Iron deficiency: Secondary | ICD-10-CM

## 2023-02-17 LAB — IRON,TIBC AND FERRITIN PANEL
%SAT: 13 % — ABNORMAL LOW (ref 16–45)
Ferritin: 13 ng/mL — ABNORMAL LOW (ref 16–232)
Iron: 40 ug/dL (ref 40–190)
TIBC: 310 ug/dL (ref 250–450)

## 2023-02-17 MED ORDER — VITAMIN B-12 1000 MCG PO TABS: 1000.0000 ug | ORAL_TABLET | Freq: Every day | ORAL | Status: AC

## 2023-02-17 NOTE — Telephone Encounter (Signed)
Called patient but no answer, left voice mail for patient to call back.   

## 2023-02-17 NOTE — Telephone Encounter (Signed)
B12 level is low. Add b12 1000 mcg once daily PO otc.   Iron was a bit low.  Restart multivitamin with minerals once daily.  Protein a bit low- increase lean protein in diet.  Cholesterol mildly elevated. Please continue work on diet/exercise and weight loss.   Repeat b12/iron in 3 months.

## 2023-02-20 NOTE — Telephone Encounter (Signed)
Called patient again, but no answer, left voice mail for patient to call back.

## 2023-02-20 NOTE — Telephone Encounter (Signed)
Pt called back. °

## 2023-02-23 ENCOUNTER — Encounter: Payer: Self-pay | Admitting: Neurology

## 2023-02-23 NOTE — Telephone Encounter (Signed)
Patient notified of all results and recommendations in detail. She was scheduled to return in 3 months for follow up labs

## 2023-04-01 NOTE — Progress Notes (Signed)
NEUROLOGY CONSULTATION NOTE  [name] MRN: [id number] DOB: Jan 05, 1976  Referring provider: Sandford Craze, NP Primary care provider: Sandford Craze, NP  Reason for consult:  multiple sclerosis  Assessment/Plan:   Multiple sclerosis.   Often, we may start considering discontinuing DMT once patients have been stable for several years by the time they are in their 35s.  She is 47 years old now and has been clinically stable for 15 years off any DMT.  Therefore, it is reasonable to remain off DMT and just monitor pending no change on brain MRI.  MRI of brain with and without contrast Recommend starting D3 1000 international units  daily.  Recheck vit D level in 6 months If MRI stable, follow up in one year.     Subjective:  Elizabeth Dixon is a 47 year old right-handed Caucasian female with history of left breast cancer who presents for multiple sclerosis.  History supplemented by referring provider note.  MRI of brain and cervical spine from 04/16/2019 personally reviewed.   She was diagnosed with multiple sclerosis in 2002 after presenting with numbness and tingling in her feet that spread up to mid thorax by mid-day.  Diagnosis was established via MRI and lumbar puncture CSF results.  She was treated with IV steroids for exacerbations.  On a couple of subsequent occasions, she would have various numbness.  She was initially on Copaxone and discontinued it around 2008-2009 when she tried to get pregnant.  She hasn't been on a DMT since then.  Repeat MRIs in the past showed no changes.  No exacerbations since then.  No prior history of optic neuritis or weakness.   Vision:  No issues Motor:  No issues Sensory:  No issues Pain:  No issues Gait:  No issues Bowel/Bladder:  No issues Fatigue:  No issues Cognition:  No issues Mood:  No issues.  I saw patient for initial consultation in November 2020.  MRI BRAIN & C-SPINE W WO performed on 04/16/2019 revealed:  1. Mild cerebral  white matter disease consistent with the provided history of multiple sclerosis. No evidence of active demyelination. 2. Normal appearance of the cervical spinal cord.  3. Mild lower cervical spine disc degeneration without stenosis.  I recommended restarting a DMT but she was lost to follow up.    02/16/2023 LABS:  CBC with WBC 6.6, HGB 12, HCT 37.9, PLT 299, ALC 1.8; CMP with Na 140, K 3.9, Cl 103, CO2 29, Ca 8.6, glucose 96, BUN 12, Cr 0.57, t bili 0.3, ALP 82, AST 2, ALT 10; vit D 30.44; B12 253;    Current DMT:  None Past DMT:  Copaxone (4 to 5 years; stopped when she tried getting pregnant)   Current medications/vitamins-supplements:  D3 (unsure of dose); melatonin 1mg ; ferrous sulfate 325mg  mg; MVI Past medications:  None   No family history of MS.    PAST MEDICAL HISTORY: Past Medical History:  Diagnosis Date   Anemia    B12 deficiency    Breast cancer (HCC) 2016   left breast- ductal carcinoma in situ   Family history of breast cancer    Family history of prostate cancer    Hyperlipidemia    hx of   Iron deficiency anemia    hx of   Multiple sclerosis (HCC)    dx 2002   Neuromuscular disorder (HCC)    HAVE MS   Pleural effusion 06/21/2021   Pneumonia    FEB,2023   Pneumonia of left lower lobe due  to infectious organism 06/05/2021    PAST SURGICAL HISTORY: Past Surgical History:  Procedure Laterality Date   BREAST LUMPECTOMY Left 08/2014   BREAST REDUCTION SURGERY Bilateral 08/2014   CESAREAN SECTION  2013   COLONOSCOPY WITH PROPOFOL N/A 09/09/2021   Procedure: COLONOSCOPY WITH PROPOFOL;  Surgeon: Iva Boop, MD;  Location: [facility];  Service: Gastroenterology;  Laterality: N/A;   FLEXIBLE SIGMOIDOSCOPY N/A 05/31/2020   Procedure: FLEXIBLE SIGMOIDOSCOPY, ICG INTRAOP ASSESSMENT OF PERFUSION;  Surgeon: Andria Meuse, MD;  Location: [facility];  Service: General;  Laterality: N/A;   SIGMOIDECTOMY  05/31/2020   XI ROBOT ASSISTED SIGMOID COLECTOMY WITH  COLORECTAL ANASTOMOSIS   VAGINA SURGERY     vagina tear from childbirth   WISDOM TOOTH EXTRACTION      MEDICATIONS: Current Outpatient Medications on File Prior to Visit  Medication Sig Dispense Refill   cyanocobalamin (VITAMIN B12) 1000 MCG tablet Take 1 tablet (1,000 mcg total) by mouth daily.     ferrous sulfate 325 (65 FE) MG tablet Take 325 mg by mouth every other day.     fluticasone (FLONASE) 50 MCG/ACT nasal spray Place 2 sprays into both nostrils daily. (Patient taking differently: Place 2 sprays into both nostrils daily as needed for allergies.) 1 g 0   Multiple Vitamins-Minerals (MULTIVITAMIN WITH MINERALS) tablet Take 1 tablet by mouth daily.     No current facility-administered medications on file prior to visit.    ALLERGIES: No Known Allergies  FAMILY HISTORY: Family History  Problem Relation Age of Onset   Colon polyps Mother    Thyroid disease Mother    Hypertension Father    Prostate cancer Father 53   Skin cancer Father 58   Dementia Father    Parkinson's disease Paternal Grandmother    Breast cancer Other        unconfirmed diagnosis, great-aunt (MGF's sister)   Colon cancer Neg Hx    Esophageal cancer Neg Hx    Stomach cancer Neg Hx    Rectal cancer Neg Hx    Crohn's disease Neg Hx     Objective:  Blood pressure (!) 142/89, pulse 78, height 5\' 3"  (1.6 m), weight (!) 327 lb 6.4 oz (148.5 kg), SpO2 97%. General: No acute distress.  Patient appears well-groomed.   Head:  Normocephalic/atraumatic Eyes:  fundi examined but not visualized Neck: supple, no paraspinal tenderness, full range of motion Heart: regular rate and rhythm Neurological Exam: Mental status: alert and oriented to person, place, and time, speech fluent and not dysarthric, language intact. Cranial nerves: CN I: not tested CN II: pupils equal, round and reactive to light, visual fields intact CN III, IV, VI:  full range of motion, no nystagmus, no ptosis CN V: facial sensation  intact. CN VII: upper and lower face symmetric CN VIII: hearing intact CN IX, X: gag intact, uvula midline CN XI: sternocleidomastoid and trapezius muscles intact CN XII: tongue midline Bulk & Tone: normal, no fasciculations. Motor:  muscle strength 5/5 throughout Sensation:  Pinprick and vibratory sensation intact. Deep Tendon Reflexes:  2+ throughout,  toes downgoing.   Finger to nose testing:  Without dysmetria.   Heel to shin:  Without dysmetria.   Gait:  Normal station and stride.  Able to turn.  Slightly unsteady tandem walk.  Romberg negative.    Thank you for allowing me to take part in the care of this patient.  Shon Millet, DO  CC: Sandford Craze, NP

## 2023-04-03 ENCOUNTER — Encounter: Payer: Self-pay | Admitting: Neurology

## 2023-04-03 ENCOUNTER — Ambulatory Visit: Payer: BC Managed Care – PPO | Admitting: Neurology

## 2023-04-03 VITALS — BP 118/83 | HR 78 | Ht 63.0 in | Wt 327.4 lb

## 2023-04-03 DIAGNOSIS — G35 Multiple sclerosis: Secondary | ICD-10-CM

## 2023-04-03 DIAGNOSIS — H43313 Vitreous membranes and strands, bilateral: Secondary | ICD-10-CM | POA: Diagnosis not present

## 2023-04-03 NOTE — Patient Instructions (Signed)
Start over the counter D3 1000 international units daily.  Would recheck vit D level in 6 months. Check MRI of brain with and without contrast Further recommendations pending results.  Otherwise, follow up one year

## 2023-04-28 ENCOUNTER — Encounter: Payer: Self-pay | Admitting: Neurology

## 2023-05-03 ENCOUNTER — Ambulatory Visit
Admission: RE | Admit: 2023-05-03 | Discharge: 2023-05-03 | Disposition: A | Discharge status: Home / Self Care | Payer: BC Managed Care – PPO | Source: Ambulatory Visit | Attending: Neurology | Admitting: Neurology

## 2023-05-03 DIAGNOSIS — G35 Multiple sclerosis: Secondary | ICD-10-CM

## 2023-05-03 MED ORDER — GADOPICLENOL 0.5 MMOL/ML IV SOLN
10.0000 mL | Freq: Once | INTRAVENOUS | Status: AC | PRN
  Administered 2023-05-03: 10 mL via INTRAVENOUS

## 2023-05-25 ENCOUNTER — Other Ambulatory Visit (INDEPENDENT_AMBULATORY_CARE_PROVIDER_SITE_OTHER): Payer: BC Managed Care – PPO

## 2023-05-25 DIAGNOSIS — E538 Deficiency of other specified B group vitamins: Secondary | ICD-10-CM | POA: Diagnosis not present

## 2023-05-25 DIAGNOSIS — E611 Iron deficiency: Secondary | ICD-10-CM | POA: Diagnosis not present

## 2023-05-25 LAB — VITAMIN B12: Vitamin B-12: 898 pg/mL (ref 211–911)

## 2023-05-26 ENCOUNTER — Encounter: Payer: Self-pay | Admitting: Family

## 2023-05-26 LAB — IRON,TIBC AND FERRITIN PANEL
%SAT: 19 % (ref 16–45)
Ferritin: 15 ng/mL — ABNORMAL LOW (ref 16–232)
Iron: 59 ug/dL (ref 40–190)
TIBC: 312 ug/dL (ref 250–450)

## 2023-08-05 ENCOUNTER — Ambulatory Visit: Admitting: Family

## 2023-08-18 ENCOUNTER — Ambulatory Visit: Payer: BC Managed Care – PPO | Admitting: Family

## 2023-09-04 ENCOUNTER — Ambulatory Visit: Admitting: Family

## 2023-09-04 ENCOUNTER — Encounter: Payer: Self-pay | Admitting: Family

## 2023-09-04 VITALS — BP 105/64 | HR 70 | Temp 98.4°F | Ht 63.0 in | Wt 308.2 lb

## 2023-09-04 DIAGNOSIS — E538 Deficiency of other specified B group vitamins: Secondary | ICD-10-CM

## 2023-09-04 DIAGNOSIS — E785 Hyperlipidemia, unspecified: Secondary | ICD-10-CM | POA: Diagnosis not present

## 2023-09-04 DIAGNOSIS — E611 Iron deficiency: Secondary | ICD-10-CM | POA: Diagnosis not present

## 2023-09-04 DIAGNOSIS — R7989 Other specified abnormal findings of blood chemistry: Secondary | ICD-10-CM | POA: Diagnosis not present

## 2023-09-04 DIAGNOSIS — Z0184 Encounter for antibody response examination: Secondary | ICD-10-CM | POA: Diagnosis not present

## 2023-09-04 DIAGNOSIS — G35 Multiple sclerosis: Secondary | ICD-10-CM

## 2023-09-04 DIAGNOSIS — M722 Plantar fascial fibromatosis: Secondary | ICD-10-CM | POA: Insufficient documentation

## 2023-09-04 DIAGNOSIS — Z85038 Personal history of other malignant neoplasm of large intestine: Secondary | ICD-10-CM

## 2023-09-04 LAB — CBC WITH DIFFERENTIAL/PLATELET
Basophils Absolute: 0 10*3/uL (ref 0.0–0.1)
Basophils Relative: 0.6 % (ref 0.0–3.0)
Eosinophils Absolute: 0.2 10*3/uL (ref 0.0–0.7)
Eosinophils Relative: 2.8 % (ref 0.0–5.0)
HCT: 40.2 % (ref 36.0–46.0)
Hemoglobin: 13.2 g/dL (ref 12.0–15.0)
Lymphocytes Relative: 27 % (ref 12.0–46.0)
Lymphs Abs: 1.7 10*3/uL (ref 0.7–4.0)
MCHC: 32.7 g/dL (ref 30.0–36.0)
MCV: 87.1 fl (ref 78.0–100.0)
Monocytes Absolute: 0.4 10*3/uL (ref 0.1–1.0)
Monocytes Relative: 6.2 % (ref 3.0–12.0)
Neutro Abs: 4 10*3/uL (ref 1.4–7.7)
Neutrophils Relative %: 63.4 % (ref 43.0–77.0)
Platelets: 284 10*3/uL (ref 150.0–400.0)
RBC: 4.62 Mil/uL (ref 3.87–5.11)
RDW: 15.8 % — ABNORMAL HIGH (ref 11.5–15.5)
WBC: 6.4 10*3/uL (ref 4.0–10.5)

## 2023-09-04 LAB — VITAMIN D 25 HYDROXY (VIT D DEFICIENCY, FRACTURES): VITD: 49.22 ng/mL (ref 30.00–100.00)

## 2023-09-04 LAB — IBC PANEL
Iron: 97 ug/dL (ref 42–145)
Saturation Ratios: 27 % (ref 20.0–50.0)
TIBC: 359.8 ug/dL (ref 250.0–450.0)
Transferrin: 257 mg/dL (ref 212.0–360.0)

## 2023-09-04 LAB — VITAMIN B12: Vitamin B-12: 844 pg/mL (ref 211–911)

## 2023-09-04 MED ORDER — VITAMIN D3 125 MCG (5000 UT) PO CAPS: 5000.0000 [IU] | ORAL_CAPSULE | Freq: Every day | ORAL | Status: AC

## 2023-09-04 MED ORDER — MELOXICAM 7.5 MG PO TABS: 7.5000 mg | ORAL_TABLET | Freq: Every day | ORAL | 0 refills | Status: DC

## 2023-09-04 NOTE — Assessment & Plan Note (Signed)
 Colo due 1/26.

## 2023-09-04 NOTE — Patient Instructions (Signed)
 VISIT SUMMARY:  You had a routine follow-up visit today. We discussed your heel pain, which is likely due to plantar fasciitis, and reviewed your overall wellness and multiple sclerosis management.  YOUR PLAN:  PLANTAR FASCIITIS: You have heel pain that is consistent with plantar fasciitis. -We will print out exercises for you to do at home. -I am prescribing meloxicam to help with the pain. -Use a frozen water bottle to roll under your heel for relief. -Wear supportive shoes to help with the pain.  WELLNESS VISIT: This was a routine follow-up visit. Your previous cholesterol levels were slightly elevated. -Continue following a low cholesterol diet. -We will check your vitamin D , vitamin B12, and iron levels today. -We will also order a complete blood count (CBC) and MMR titer.  MULTIPLE SCLEROSIS WITHOUT CURRENT SYMPTOMS: You are currently not experiencing any symptoms and are under the care of a neurologist. -Continue with your annual follow-up visits with your neurologist.

## 2023-09-04 NOTE — Assessment & Plan Note (Signed)
 Continues b12 1000 mcg once daily PO.

## 2023-09-04 NOTE — Assessment & Plan Note (Signed)
 Lab Results  Component Value Date   CHOL 203 (H) 02/16/2023   HDL 55.50 02/16/2023   LDLCALC 116 (H) 02/16/2023   TRIG 156.0 (H) 02/16/2023   CHOLHDL 4 02/16/2023   Continues to work on low cholesterol dit.

## 2023-09-04 NOTE — Assessment & Plan Note (Signed)
 Heel pain consistent with plantar fasciitis. Discussed meloxicam and exercises. - Printed plantar fasciitis exercises for pt to do at home. - Prescribe short course meloxicam. - Advise use of frozen water bottle for heel rolling in the evenings. - Recommend supportive shoes.

## 2023-09-04 NOTE — Progress Notes (Signed)
 Subjective:     Patient ID: Elizabeth Dixon, female    DOB: 06-06-1975, 48 y.o.   MRN: [id number]  Chief Complaint  Patient presents with   Medical Management of Chronic Issues    Patient presents today for a 6 month follow-up.    HPI  Discussed the use of AI scribe software for clinical note transcription with the patient, who gave verbal consent to proceed.  History of Present Illness  Elizabeth Dixon is a 48 year old female with multiple sclerosis who presents for a routine follow-up visit.  She feels stable with her multiple sclerosis and has not experienced symptoms such as tingling in her extremities or tongue pain. She follows up with a specialist and plans annual visits. She takes vitamin B12 daily and uses a vitamin organizer for her medications. She is on a low cholesterol diet and her last cholesterol check was in October. She takes vitamin D3 daily, likely at a dose of 5000 IU, and iron, 65 mg daily. Her iron levels will be updated today. She experiences heel pain on the bottom of the heel, possibly due to plantar fasciitis, with some swelling and no known injury. Achilles stretches provide some relief.    There are no preventive care reminders to display for this patient.  Past Medical History:  Diagnosis Date   Anemia    B12 deficiency    Breast cancer (HCC) 2016   left breast- ductal carcinoma in situ   Family history of breast cancer    Family history of prostate cancer    Hyperlipidemia    hx of   Iron deficiency anemia    hx of   Multiple sclerosis (HCC)    dx 2002   Neuromuscular disorder (HCC)    HAVE MS   Pleural effusion 06/21/2021   Pneumonia    FEB,2023   Pneumonia of left lower lobe due to infectious organism 06/05/2021    Past Surgical History:  Procedure Laterality Date   BREAST LUMPECTOMY Left 08/2014   BREAST REDUCTION SURGERY Bilateral 08/2014   CESAREAN SECTION  2013   COLONOSCOPY WITH PROPOFOL  N/A 09/09/2021   Procedure: COLONOSCOPY WITH  PROPOFOL ;  Surgeon: Kenney Peacemaker, MD;  Location: [facility];  Service: Gastroenterology;  Laterality: N/A;   FLEXIBLE SIGMOIDOSCOPY N/A 05/31/2020   Procedure: FLEXIBLE SIGMOIDOSCOPY, ICG INTRAOP ASSESSMENT OF PERFUSION;  Surgeon: Melvenia Stabs, MD;  Location: [facility];  Service: General;  Laterality: N/A;   SIGMOIDECTOMY  05/31/2020   XI ROBOT ASSISTED SIGMOID COLECTOMY WITH COLORECTAL ANASTOMOSIS   VAGINA SURGERY     vagina tear from childbirth   WISDOM TOOTH EXTRACTION      Family History  Problem Relation Age of Onset   Colon polyps Mother    Thyroid disease Mother    Hypertension Father    Prostate cancer Father 40   Skin cancer Father 44   Dementia Father    Parkinson's disease Paternal Grandmother    Breast cancer Other        unconfirmed diagnosis, great-aunt (MGF's sister)   Colon cancer Neg Hx    Esophageal cancer Neg Hx    Stomach cancer Neg Hx    Rectal cancer Neg Hx    Crohn's disease Neg Hx     Social History   Socioeconomic History   Marital status: Married    Spouse name: Not on file   Number of children: Not on file   Years of education: Not on file   Highest education  level: Bachelor's degree (e.g., BA, AB, BS)  Occupational History   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  Tobacco Use   Smoking status: Former    Current packs/day: 0.00    Average packs/day: 1 pack/day for 5.0 years (5.0 ttl pk-yrs)    Types: Cigarettes    Start date: 04/28/1998    Quit date: 04/29/2003    Years since quitting: 20.3    Passive exposure: Never   Smokeless tobacco: Never  Vaping Use   Vaping status: Never Used  Substance and Sexual Activity   Alcohol use: Yes    Alcohol/week: 1.0 standard drink of alcohol    Types: 1 Standard drinks or equivalent per week    Comment: once a wk   Drug use: Never   Sexual activity: Yes    Partners: Male    Birth control/protection: Surgical    Comment: husband had vasectomy  Other Topics Concern   Not on file  Social History Narrative    Works as an Publishing rights manager   Married-  Children 2013 son and 2011 daughter   Moved from [place]   Enjoys children's sports   Enjoys outside activities   dog   Complete bachelors degree      Right handed   Social Drivers of Health   Financial Resource Strain: Low Risk  (09/04/2023)   Overall Financial Resource Strain (CARDIA)    Difficulty of Paying Living Expenses: Not very hard  Food Insecurity: No Food Insecurity (09/04/2023)   Hunger Vital Sign    Worried About Running Out of Food in the Last Year: Never true    Ran Out of Food in the Last Year: Never true  Transportation Needs: No Transportation Needs (09/04/2023)   PRAPARE - Administrator, Civil Service (Medical): No    Lack of Transportation (Non-Medical): No  Physical Activity: Insufficiently Active (09/04/2023)   Exercise Vital Sign    Days of Exercise per Week: 3 days    Minutes of Exercise per Session: 20 min  Stress: No Stress Concern Present (09/04/2023)   [employer] of Occupational Health - Occupational Stress Questionnaire    Feeling of Stress : Not at all  Social Connections: Moderately Integrated (09/04/2023)   Social Connection and Isolation Panel [NHANES]    Frequency of Communication with Friends and Family: More than three times a week    Frequency of Social Gatherings with Friends and Family: Twice a week    Attends Religious Services: More than 4 times per year    Active Member of [employer] or Organizations: No    Attends Engineer, structural: Not on file    Marital Status: Married  Catering manager Violence: Not At Risk (02/01/2019)   Humiliation, Afraid, Rape, and Kick questionnaire    Fear of Current or Ex-Partner: No    Emotionally Abused: No    Physically Abused: No    Sexually Abused: No    Outpatient Medications Prior to Visit  Medication Sig Dispense Refill   cyanocobalamin  (VITAMIN B12) 1000 MCG tablet Take 1 tablet (1,000 mcg total) by mouth daily.     ferrous sulfate  325 (65 FE)  MG tablet Take 325 mg by mouth every other day.     Multiple Vitamins-Minerals (MULTIVITAMIN WITH MINERALS) tablet Take 1 tablet by mouth daily.     fluticasone  (FLONASE ) 50 MCG/ACT nasal spray Place 2 sprays into both nostrils daily. (Patient taking differently: Place 2 sprays into both nostrils daily as needed for allergies.) 1 g 0  No facility-administered medications prior to visit.    No Known Allergies  ROS See HPI    Objective:     Physical Exam Constitutional:      General: She is not in acute distress.    Appearance: Normal appearance. She is well-developed.  HENT:     Head: Normocephalic and atraumatic.     Right Ear: External ear normal.     Left Ear: External ear normal.  Eyes:     General: No scleral icterus. Neck:     Thyroid: No thyromegaly.  Cardiovascular:     Rate and Rhythm: Normal rate and regular rhythm.     Heart sounds: Normal heart sounds. No murmur heard. Pulmonary:     Effort: Pulmonary effort is normal. No respiratory distress.     Breath sounds: Normal breath sounds. No wheezing.  Musculoskeletal:     Cervical back: Neck supple.  Skin:    General: Skin is warm and dry.  Neurological:     Mental Status: She is alert and oriented to person, place, and time.  Psychiatric:        Mood and Affect: Mood normal.        Behavior: Behavior normal.        Thought Content: Thought content normal.        Judgment: Judgment normal.      BP 105/64   Pulse 70   Temp 98.4 F (36.9 C)   Ht 5\' 3"  (1.6 m)   Wt (!) 308 lb 3.2 oz (139.8 kg)   LMP 08/17/2023 (Exact Date)   SpO2 100%   BMI 54.60 kg/m  Wt Readings from Last 3 Encounters:  09/04/23 (!) 308 lb 3.2 oz (139.8 kg)  04/03/23 (!) 327 lb 6.4 oz (148.5 kg)  02/16/23 (!) 335 lb (152 kg)       Assessment & Plan:   Problem List Items Addressed This Visit       Unprioritized   Plantar fasciitis   Heel pain consistent with plantar fasciitis. Discussed meloxicam and exercises. - Printed  plantar fasciitis exercises for pt to do at home. - Prescribe short course meloxicam. - Advise use of frozen water bottle for heel rolling in the evenings. - Recommend supportive shoes.       Relevant Medications   meloxicam (MOBIC) 7.5 MG tablet   Personal history of colon cancer   Colo due 1/26.       Multiple sclerosis (HCC)   Working with Dr. Festus Hubert.  Opting not to do medication.        Low vitamin D  level   Continues vit D 5000 international units daily.       Relevant Medications   Cholecalciferol (VITAMIN D3) 125 MCG (5000 UT) CAPS   Other Relevant Orders   VITAMIN D  25 Hydroxy (Vit-D Deficiency, Fractures)   Iron deficiency   Taking iron 325mg  once daily.       Relevant Orders   IBC panel   CBC w/Diff   Hyperlipidemia - Primary   Lab Results  Component Value Date   CHOL 203 (H) 02/16/2023   HDL 55.50 02/16/2023   LDLCALC 116 (H) 02/16/2023   TRIG 156.0 (H) 02/16/2023   CHOLHDL 4 02/16/2023   Continues to work on low cholesterol dit.       B12 deficiency   Continues b12 1000 mcg once daily PO.       Relevant Orders   B12   Other Visit Diagnoses  Immunity status testing       Relevant Orders   Measles/Mumps/Rubella Immunity       I am having Byanca Rotz start on Vitamin D3 and meloxicam. I am also having her maintain her fluticasone , multivitamin with minerals, ferrous sulfate , and cyanocobalamin .  Meds ordered this encounter  Medications   Cholecalciferol (VITAMIN D3) 125 MCG (5000 UT) CAPS    Sig: Take 1 capsule (5,000 Units total) by mouth daily.    Supervising Provider:   Randie Bustle A [[id number]]   meloxicam (MOBIC) 7.5 MG tablet    Sig: Take 1 tablet (7.5 mg total) by mouth daily.    Dispense:  14 tablet    Refill:  0    Supervising Provider:   Randie Bustle A [[id number]]

## 2023-09-04 NOTE — Assessment & Plan Note (Signed)
 Working with Dr. Festus Hubert.  Opting not to do medication.

## 2023-09-04 NOTE — Assessment & Plan Note (Signed)
 Continues vit D 5000 international units daily.

## 2023-09-04 NOTE — Assessment & Plan Note (Signed)
 Taking iron 325mg  once daily.

## 2023-09-05 LAB — MEASLES/MUMPS/RUBELLA IMMUNITY
Mumps IgG: <9 [AU]/ml — ABNORMAL LOW
Rubella: 3.21 {index}
Rubeola IgG: 144 [AU]/ml

## 2023-09-07 ENCOUNTER — Telehealth: Payer: Self-pay | Admitting: Family

## 2023-09-07 NOTE — Telephone Encounter (Signed)
 Titer shows immunity to measles but not mumps.  I would recommend MMR booster.

## 2023-09-08 NOTE — Telephone Encounter (Signed)
 Lvm for patient to call back, will need nurse visit appointment for MMR booster

## 2023-09-11 NOTE — Telephone Encounter (Signed)
 Patient notified she needs MMR and she will need a nurse visit appointment for this. The call was disconnected and she did not answer phone after. Hopefully she will call for appointment soon.

## 2024-01-16 DIAGNOSIS — Z1231 Encounter for screening mammogram for malignant neoplasm of breast: Secondary | ICD-10-CM | POA: Diagnosis not present

## 2024-01-16 LAB — HM MAMMOGRAPHY

## 2024-01-27 DIAGNOSIS — Z6841 Body Mass Index (BMI) 40.0 and over, adult: Secondary | ICD-10-CM | POA: Diagnosis not present

## 2024-01-27 DIAGNOSIS — Z1331 Encounter for screening for depression: Secondary | ICD-10-CM | POA: Diagnosis not present

## 2024-01-27 DIAGNOSIS — Z01419 Encounter for gynecological examination (general) (routine) without abnormal findings: Secondary | ICD-10-CM | POA: Diagnosis not present

## 2024-01-27 DIAGNOSIS — N938 Other specified abnormal uterine and vaginal bleeding: Secondary | ICD-10-CM | POA: Diagnosis not present

## 2024-03-09 ENCOUNTER — Telehealth: Payer: Self-pay | Admitting: Family

## 2024-03-09 ENCOUNTER — Encounter: Admitting: Family

## 2024-03-09 NOTE — Telephone Encounter (Signed)
 I left a voicemail for pt letting them know we need to reschedule the appt with Eleanor Ponto today. The provider has allowed us  to override them onto her scheduled and the pt has been asked to call back and ask to speak with someone in the office so we can get the appt overrided. Or they can call and schedule for another day with provider that does not need someone in the office to schedule.

## 2024-03-31 NOTE — Progress Notes (Unsigned)
 NEUROLOGY FOLLOW UP OFFICE NOTE  Elizabeth Dixon [id number]  Assessment/Plan:   Multiple sclerosis.   DMT:  off DMT.  Stable for many years. D3 1000 I U daily Follow up 1 year or as needed Cervicogenic headache Keep computer screen at eye level at work so neck is in neutral position Follow up 1 year or as needed  Subjective:  Elizabeth Dixon is a 48 year old right-handed Caucasian female with history of left breast cancer who follows up for multiple sclerosis.  MRI of brain and cervical spine from 05/03/2023 personally reviewed.   UPDATE: DMT:  none Current medications/vitamins-supplements:  D3 1000 I U daily; melatonin 1mg ; ferrous sulfate  325mg  mg; MVI  05/03/2023 MRI BRAIN W WO:  1. Redemonstrated T2 hyperintense lesions in the periventricular, deep, and juxtacortical white matter, compatible with the patient's history of multiple sclerosis. No new or enhancing lesions are seen [when compared to prior MRI from 04/16/2019]. 2. Dural-based, enhancing mass along the posterior right frontal convexity, which measures up to 10 mm, and is not significantly changed in size from the prior exam, most consistent with a meningioma. No evidence of edema in the underlying right frontal lobe.  Over the past couple of months, she reports new headaches.  It is a 4-5/10 non-throbbing headache at the base of her skull and radiates to the temples.  Sometimes neck pain.  Sometimes eyes ache and vision is strained.  No nausea, photophobia and phonophobia.  Notices it more at work when she is at her desk on the computer.  Usually occurs 4 to 5 times a month and lasts 1 hour with extra-strength Tylenol   09/04/2023 LABS:  Vit D 49.22, B12 844  Vision:  No issues Motor:  No issues Sensory:  No issues Pain:  No issues Gait:  No issues Bowel/Bladder:  No issues Fatigue:  No issues Cognition:  No issues Mood:  No issues.  HISTORY: She was diagnosed with multiple sclerosis in 2002 after presenting with numbness  and tingling in her feet that spread up to mid thorax by mid-day.  Diagnosis was established via MRI and lumbar puncture CSF results.  She was treated with IV steroids for exacerbations.  On a couple of subsequent occasions, she would have various numbness.  She was initially on Copaxone and discontinued it around 2008-2009 when she tried to get pregnant.  She hasn't been on a DMT since then.  Repeat MRIs in the past showed no changes.  No exacerbations since then.  No prior history of optic neuritis or weakness.  I saw patient for initial consultation in November 2020.  MRI BRAIN & C-SPINE W WO performed on 04/16/2019 revealed:  1. Mild cerebral white matter disease consistent with the provided history of multiple sclerosis. No evidence of active demyelination. 2. Normal appearance of the cervical spinal cord.  3. Mild lower cervical spine disc degeneration without stenosis.  I recommended restarting a DMT but she was lost to follow up.    Past DMT:  Copaxone (4 to 5 years; stopped when she tried getting pregnant)  No family history of MS.  PAST MEDICAL HISTORY: Past Medical History:  Diagnosis Date   Anemia    B12 deficiency    Breast cancer (HCC) 2016   left breast- ductal carcinoma in situ   Family history of breast cancer    Family history of prostate cancer    Hyperlipidemia    hx of   Iron deficiency anemia    hx of  Multiple sclerosis    dx 2002   Neuromuscular disorder (HCC)    HAVE MS   Pleural effusion 06/21/2021   Pneumonia    FEB,2023   Pneumonia of left lower lobe due to infectious organism 06/05/2021    MEDICATIONS: Current Outpatient Medications on File Prior to Visit  Medication Sig Dispense Refill   Cholecalciferol (VITAMIN D3) 125 MCG (5000 UT) CAPS Take 1 capsule (5,000 Units total) by mouth daily.     cyanocobalamin  (VITAMIN B12) 1000 MCG tablet Take 1 tablet (1,000 mcg total) by mouth daily.     ferrous sulfate  325 (65 FE) MG tablet Take 325 mg by mouth  every other day.     fluticasone  (FLONASE ) 50 MCG/ACT nasal spray Place 2 sprays into both nostrils daily. (Patient taking differently: Place 2 sprays into both nostrils daily as needed for allergies.) 1 g 0   meloxicam  (MOBIC ) 7.5 MG tablet Take 1 tablet (7.5 mg total) by mouth daily. 14 tablet 0   Multiple Vitamins-Minerals (MULTIVITAMIN WITH MINERALS) tablet Take 1 tablet by mouth daily.     No current facility-administered medications on file prior to visit.    ALLERGIES: No Known Allergies  FAMILY HISTORY: Family History  Problem Relation Age of Onset   Colon polyps Mother    Thyroid disease Mother    Hypertension Father    Prostate cancer Father 72   Skin cancer Father 26   Dementia Father    Parkinson's disease Paternal Grandmother    Breast cancer Other        unconfirmed diagnosis, great-aunt (MGF's sister)   Colon cancer Neg Hx    Esophageal cancer Neg Hx    Stomach cancer Neg Hx    Rectal cancer Neg Hx    Crohn's disease Neg Hx       Objective:  Blood pressure 134/82, pulse 80, height 5' 4 (1.626 m), weight (!) 335 lb 12.8 oz (152.3 kg), SpO2 98%. General: No acute distress.  Patient appears well-groomed.   Head:  Normocephalic/atraumatic Eyes:  Fundi examined but not visualized Neck: supple, no paraspinal tenderness, full range of motion Heart:  Regular rate and rhythm Neurological Exam: alert and oriented.  Speech fluent and not dysarthric, language intact.  CN II-XII intact. Bulk and tone normal, muscle strength 5/5 throughout.  Sensation to light touch intact.  Deep tendon reflexes 2+ throughout, toes downgoing.  Finger to nose testing intact.  Gait normal, Romberg negative.   Juliene Dunnings, DO  CC: Eleanor Ponto, NP

## 2024-04-04 ENCOUNTER — Encounter: Payer: Self-pay | Admitting: Neurology

## 2024-04-04 ENCOUNTER — Ambulatory Visit: Payer: BC Managed Care – PPO | Admitting: Neurology

## 2024-04-04 VITALS — BP 134/82 | HR 80 | Ht 64.0 in | Wt 335.8 lb

## 2024-04-04 DIAGNOSIS — G35D Multiple sclerosis, unspecified: Secondary | ICD-10-CM | POA: Diagnosis not present

## 2024-04-04 DIAGNOSIS — G4486 Cervicogenic headache: Secondary | ICD-10-CM

## 2024-04-15 ENCOUNTER — Encounter: Payer: Self-pay | Admitting: Family

## 2024-04-15 ENCOUNTER — Telehealth: Payer: Self-pay | Admitting: Family

## 2024-04-15 ENCOUNTER — Ambulatory Visit: Admitting: Family

## 2024-04-15 VITALS — BP 119/69 | HR 78 | Temp 98.7°F | Resp 16 | Ht 64.0 in | Wt 328.0 lb

## 2024-04-15 DIAGNOSIS — Z86000 Personal history of in-situ neoplasm of breast: Secondary | ICD-10-CM | POA: Diagnosis not present

## 2024-04-15 DIAGNOSIS — Z85038 Personal history of other malignant neoplasm of large intestine: Secondary | ICD-10-CM | POA: Diagnosis not present

## 2024-04-15 DIAGNOSIS — E611 Iron deficiency: Secondary | ICD-10-CM

## 2024-04-15 DIAGNOSIS — E785 Hyperlipidemia, unspecified: Secondary | ICD-10-CM

## 2024-04-15 DIAGNOSIS — J029 Acute pharyngitis, unspecified: Secondary | ICD-10-CM | POA: Insufficient documentation

## 2024-04-15 DIAGNOSIS — Z0000 Encounter for general adult medical examination without abnormal findings: Secondary | ICD-10-CM

## 2024-04-15 DIAGNOSIS — E538 Deficiency of other specified B group vitamins: Secondary | ICD-10-CM

## 2024-04-15 LAB — POCT RAPID STREP A (OFFICE): Rapid Strep A Screen: NEGATIVE

## 2024-04-15 NOTE — Progress Notes (Signed)
 0  Subjective:     Patient ID: Elizabeth Dixon, female    DOB: 28-Dec-1975, 48 y.o.   MRN: [id number]  Chief Complaint  Patient presents with   Annual Exam    HPI  Discussed the use of AI scribe software for clinical note transcription with the patient, who gave verbal consent to proceed.  History of Present Illness Elizabeth Dixon is a 48 year old female with hx of breast cancer, colon cancer, and MS, who presents for an annual physical exam.  She has experienced congestion over the past week. Her son was recently diagnosed with strep throat. She currently feels less symptomatic, though there is some soreness on the right side of her throat. No cough or additional respiratory symptoms are present.  She has been dealing with heavy menstrual bleeding, which has significantly improved with Tranexamic Acid (Trianex) over the past two months, resulting in substantially less clotting and flow. She is scheduled for a uterine lining sampling in January.  She experiences itching along her scar line on her breast and down the middle of her spine for the past couple of months. Despite using lotion, there has been no significant relief. Additionally, a red patch on her shoulder has been present for a couple of months, initially noticed by her husband.  She has more frequent headaches, which are positional, occurring at the base of her skull and causing pressure behind her eyes. She suspects they may be related to workstation ergonomics or hormonal changes, as they seem to occur during certain parts of her menstrual cycle. She has discussed these with her neurologist.  She takes an iron supplement every other day, as daily use was excessive. She also takes a B12 supplement and occasionally uses probiotics for digestive health. No issues with urination, muscle or joint pain, or significant digestive concerns aside from occasional hemorrhoid-related blood in the stool.  She notes a weight loss of seven pounds  since her last visit and mentions having an indoor bike at home, which she plans to use more regularly. She acknowledges that her diet and exercise routine have been lacking but intends to improve them in the new year.   Immunizations:  Diet:  fair Exercise: fair Colonoscopy: due 3/26 Pap Smear: 10/23- following with GYN Mammogram: up to date Vision: up to date Dental: up to date      Health Maintenance Due  Topic Date Due   Zoster Vaccines- Shingrix (1 of 2) Never done   Pneumococcal Vaccine: 50+ Years (1 of 1 - PCV) Never done    Past Medical History:  Diagnosis Date   Anemia    B12 deficiency    Breast cancer (HCC) 2016   left breast- ductal carcinoma in situ   Family history of breast cancer    Family history of prostate cancer    Hyperlipidemia    hx of   Iron deficiency anemia    hx of   Multiple sclerosis    dx 2002   Neuromuscular disorder (HCC)    HAVE MS   Pleural effusion 06/21/2021   Pneumonia    FEB,2023   Pneumonia of left lower lobe due to infectious organism 06/05/2021    Past Surgical History:  Procedure Laterality Date   BREAST LUMPECTOMY Left 08/2014   BREAST REDUCTION SURGERY Bilateral 08/2014   CESAREAN SECTION  2013   COLONOSCOPY WITH PROPOFOL  N/A 09/09/2021   Procedure: COLONOSCOPY WITH PROPOFOL ;  Surgeon: Avram Lupita BRAVO, MD;  Location: [facility];  Service: Gastroenterology;  Laterality: N/A;   FLEXIBLE SIGMOIDOSCOPY N/A 05/31/2020   Procedure: FLEXIBLE SIGMOIDOSCOPY, ICG INTRAOP ASSESSMENT OF PERFUSION;  Surgeon: Teresa Lonni HERO, MD;  Location: [facility];  Service: General;  Laterality: N/A;   SIGMOIDECTOMY  05/31/2020   XI ROBOT ASSISTED SIGMOID COLECTOMY WITH COLORECTAL ANASTOMOSIS   VAGINA SURGERY     vagina tear from childbirth   WISDOM TOOTH EXTRACTION      Family History  Problem Relation Age of Onset   Colon polyps Mother    Thyroid disease Mother    Hypertension Father    Prostate cancer Father 22   Skin cancer  Father 105   Dementia Father    Parkinson's disease Paternal Grandmother    Breast cancer Other        unconfirmed diagnosis, great-aunt (MGF's sister)   Colon cancer Neg Hx    Esophageal cancer Neg Hx    Stomach cancer Neg Hx    Rectal cancer Neg Hx    Crohn's disease Neg Hx     Social History   Socioeconomic History   Marital status: Married    Spouse name: Not on file   Number of children: Not on file   Years of education: Not on file   Highest education level: Bachelor's degree (e.g., BA, AB, BS)  Occupational History   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  Tobacco Use   Smoking status: Former    Current packs/day: 0.00    Average packs/day: 1 pack/day for 5.0 years (5.0 ttl pk-yrs)    Types: Cigarettes    Start date: 04/28/1998    Quit date: 04/29/2003    Years since quitting: 20.9    Passive exposure: Never   Smokeless tobacco: Never  Vaping Use   Vaping status: Never Used  Substance and Sexual Activity   Alcohol use: Yes    Alcohol/week: 1.0 standard drink of alcohol    Types: 1 Standard drinks or equivalent per week    Comment: once a wk   Drug use: Never   Sexual activity: Yes    Partners: Male    Birth control/protection: Surgical    Comment: husband had vasectomy  Other Topics Concern   Not on file  Social History Narrative   Works as an publishing rights manager   Married-  Children 2013 son and 2011 daughter   Moved from [place]   Enjoys children's sports   Enjoys outside activities   dog   Complete bachelors degree      Right handed   Social Drivers of Health   Tobacco Use: Medium Risk (04/15/2024)   Patient History    Smoking Tobacco Use: Former    Smokeless Tobacco Use: Never    Passive Exposure: Never  Physicist, Medical Strain: Low Risk (09/04/2023)   Overall Financial Resource Strain (CARDIA)    Difficulty of Paying Living Expenses: Not very hard  Food Insecurity: No Food Insecurity (09/04/2023)   Hunger Vital Sign    Worried About Running Out of Food in the Last Year:  Never true    Ran Out of Food in the Last Year: Never true  Transportation Needs: No Transportation Needs (03/08/2024)   Epic    Lack of Transportation (Medical): No    Lack of Transportation (Non-Medical): No  Physical Activity: Insufficiently Active (09/04/2023)   Exercise Vital Sign    Days of Exercise per Week: 3 days    Minutes of Exercise per Session: 20 min  Stress: No Stress Concern Present (09/04/2023)   [employer] of Occupational  Health - Occupational Stress Questionnaire    Feeling of Stress : Not at all  Social Connections: Moderately Integrated (09/04/2023)   Social Connection and Isolation Panel    Frequency of Communication with Friends and Family: More than three times a week    Frequency of Social Gatherings with Friends and Family: Twice a week    Attends Religious Services: More than 4 times per year    Active Member of [employer] or Organizations: No    Attends Engineer, Structural: Not on file    Marital Status: Married  Catering Manager Violence: Not on file  Depression (PHQ2-9): Low Risk (04/15/2024)   Depression (PHQ2-9)    PHQ-2 Score: 2  Alcohol Screen: Not on file  Housing: Unknown (03/08/2024)   Epic    Unable to Pay for Housing in the Last Year: No    Number of Times Moved in the Last Year: Not on file    Homeless in the Last Year: No  Utilities: Not on file  Health Literacy: Not on file    Outpatient Medications Prior to Visit  Medication Sig Dispense Refill   Cholecalciferol (VITAMIN D3) 125 MCG (5000 UT) CAPS Take 1 capsule (5,000 Units total) by mouth daily.     cyanocobalamin  (VITAMIN B12) 1000 MCG tablet Take 1 tablet (1,000 mcg total) by mouth daily.     ferrous sulfate  325 (65 FE) MG tablet Take 325 mg by mouth every other day.     fluticasone  (FLONASE ) 50 MCG/ACT nasal spray Place 2 sprays into both nostrils daily. (Patient taking differently: Place 2 sprays into both nostrils daily as needed for allergies.) 1 g 0   Multiple  Vitamins-Minerals (MULTIVITAMIN WITH MINERALS) tablet Take 1 tablet by mouth daily.     tranexamic acid (LYSTEDA) 650 MG TABS tablet Take 1,300 mg by mouth 3 (three) times daily.     No facility-administered medications prior to visit.    Allergies[1]  Review of Systems  Constitutional:  Positive for weight loss.  HENT:  Positive for sore throat. Negative for congestion and hearing loss.   Eyes:  Negative for blurred vision.  Respiratory:  Negative for cough.   Cardiovascular:  Negative for leg swelling.  Gastrointestinal:  Positive for blood in stool (once due to hemorroid). Negative for constipation and diarrhea.  Genitourinary:  Negative for dysuria and frequency.  Musculoskeletal:  Negative for joint pain and myalgias.  Skin:  Negative for rash.  Neurological:  Positive for headaches (having more headaches, workign wiht Dr. Skeet).  Psychiatric/Behavioral:  Negative for depression. The patient is not nervous/anxious.        Objective:    Physical Exam   BP 119/69 (BP Location: Right Arm, Patient Position: Sitting, Cuff Size: Normal)   Pulse 78   Temp 98.7 F (37.1 C) (Oral)   Resp 16   Ht 5' 4 (1.626 m)   Wt (!) 328 lb (148.8 kg)   SpO2 100%   BMI 56.30 kg/m  Wt Readings from Last 3 Encounters:  04/15/24 (!) 328 lb (148.8 kg)  04/04/24 (!) 335 lb 12.8 oz (152.3 kg)  09/04/23 (!) 308 lb 3.2 oz (139.8 kg)   Physical Exam  Constitutional: She is oriented to person, place, and time. She appears well-developed and well-nourished. No distress.  HENT:  Head: Normocephalic and atraumatic.  Right Ear: Tympanic membrane and ear canal normal with the exception of some mild streak of erythema across upper TM Left Ear: Tympanic membrane and ear canal normal.  Mouth/Throat: Oropharynx is clear and moist. Mild erythema without exudates Eyes: Pupils are equal, round, and reactive to light. No scleral icterus.  Neck: Normal range of motion. No thyromegaly present.   Cardiovascular: Normal rate and regular rhythm.   No murmur heard. Pulmonary/Chest: Effort normal and breath sounds normal. No respiratory distress. He has no wheezes. She has no rales. She exhibits no tenderness.  Abdominal: Soft. Bowel sounds are normal. She exhibits no distension and no mass. There is no tenderness. There is no rebound and no guarding.  Musculoskeletal: She exhibits no edema.  Lymphadenopathy:    She has no cervical adenopathy.  Neurological: She is alert and oriented to person, place, and time. She has normal patellar reflexes. She exhibits normal muscle tone. Coordination normal.  Skin: Skin is warm and dry.  Mild pink discoloration of circular patch of skin left upper posterior back (likely post radiation changes), dry skin on back noted Psychiatric: She has a normal mood and affect. Her behavior is normal. Judgment and thought content normal.  Breast/Pelvic:  deferred       Assessment & Plan:       Assessment & Plan:   Problem List Items Addressed This Visit       Unprioritized   Sore throat   Rapid strep negative. Mild erythema right ear- no otalgia. She will let me know if she develops ear pain and we can consider an antibiotic.       Relevant Orders   POCT rapid strep A   Preventative health care    She is a candidate for shingles and pneumonia vaccines. Colonoscopy is due in May 2026. Mammogram was normal in September 2025. Pap smear is up to date. Vision and dental are current. Weight decreased by 7 pounds since last visit. - Consider shingles vaccine at a later date or at a pharmacy. - Consider pneumonia vaccine. - Patient will schedule colonoscopy for May 2026. - Continue routine vision and dental check-ups. - Continue work on [employer], exercise and weight loss      Iron deficiency    Iron levels are well-managed with current supplementation regimen. - Continue ferrous sulfate  325 MG oral every other day.      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 She is s/p lumpectomy with bilateral oncoplastic reductions and adjuvant radiation therapy to the left breast.       History of colon cancer   Diagnosed 2022 on routine colonoscopy.    1. Surgical [P], colon, sigmoid, polyp (1) - TUBULAR ADENOMA. - NEGATIVE FOR HIGH GRADE DYSPLASIA. 2. Surgical [P], colon, distal sigmoid   Patient underwent sigmoidectomy.      B12 deficiency    B12 levels are well-managed with current supplementation regimen. - Continue cyanocobalamin  (vitamin B12) 1000 MCG oral daily.        Assessment & Plan     I am having Acire Kalter maintain her fluticasone , multivitamin with minerals, ferrous sulfate , cyanocobalamin , Vitamin D3, and tranexamic acid.  No orders of the defined types were placed in this encounter.     [1] No Known Allergies

## 2024-04-15 NOTE — Assessment & Plan Note (Signed)
" °  She is a candidate for shingles and pneumonia vaccines. Colonoscopy is due in May 2026. Mammogram was normal in September 2025. Pap smear is up to date. Vision and dental are current. Weight decreased by 7 pounds since last visit. - Consider shingles vaccine at a later date or at a pharmacy. - Consider pneumonia vaccine. - Patient will schedule colonoscopy for May 2026. - Continue routine vision and dental check-ups. - Continue work on [employer], exercise and weight loss "

## 2024-04-15 NOTE — Assessment & Plan Note (Signed)
 Rapid strep negative. Mild erythema right ear- no otalgia. She will let me know if she develops ear pain and we can consider an antibiotic.

## 2024-04-15 NOTE — Assessment & Plan Note (Signed)
" °  B12 levels are well-managed with current supplementation regimen. - Continue cyanocobalamin  (vitamin B12) 1000 MCG oral daily.  "

## 2024-04-15 NOTE — Assessment & Plan Note (Signed)
 Diagnosed 2022 on routine colonoscopy.    1. Surgical [P], colon, sigmoid, polyp (1) - TUBULAR ADENOMA. - NEGATIVE FOR HIGH GRADE DYSPLASIA. 2. Surgical [P], colon, distal sigmoid   Patient underwent sigmoidectomy.

## 2024-04-15 NOTE — Assessment & Plan Note (Signed)
 Stage 0 TisN0 Left breast PR+ DCIS diagnosed in 2016. She is s/p lumpectomy with bilateral oncoplastic reductions and adjuvant radiation therapy to the left breast.

## 2024-04-15 NOTE — Assessment & Plan Note (Signed)
" °  Iron levels are well-managed with current supplementation regimen. - Continue ferrous sulfate  325 MG oral every other day. "

## 2024-04-15 NOTE — Telephone Encounter (Signed)
 Can you please abstract mammo from [facility].

## 2024-04-15 NOTE — Telephone Encounter (Signed)
 Done

## 2024-04-15 NOTE — Patient Instructions (Signed)
" °  VISIT SUMMARY: Today, you came in for your annual physical exam. We discussed several health concerns, including your recent congestion, heavy menstrual bleeding, itching along your scar line, headaches, and your current supplement regimen. We also reviewed your general health maintenance and vaccination status.  YOUR PLAN: -HYPERLIPIDEMIA: Hyperlipidemia means you have high levels of cholesterol in your blood. We have ordered cholesterol and kidney function tests to monitor your condition.  -IRON DEFICIENCY: Iron deficiency means you have low levels of iron in your blood. Your iron levels are well-managed with your current supplementation regimen. Please continue taking ferrous sulfate  325 MG orally every other day.  -VITAMIN B12 DEFICIENCY: Vitamin B12 deficiency means you have low levels of vitamin B12 in your blood. Your B12 levels are well-managed with your current supplementation regimen. Please continue taking cyanocobalamin  (vitamin B12) 1000 MCG orally daily.  -GENERAL HEALTH MAINTENANCE: We reviewed your general health maintenance. You are a candidate for shingles and pneumonia vaccines. Your colonoscopy is due in May 2026, and your mammogram was normal in September 2024. Your Pap smear is up to date, and your vision and dental check-ups are current. You have lost 7 pounds since your last visit. Consider getting the shingles vaccine at a later date or at a pharmacy and the pneumonia vaccine.  INSTRUCTIONS: Please follow up with the recommended cholesterol and kidney function tests. Continue your current iron and vitamin B12 supplementation regimen. Consider getting the shingles and pneumonia vaccines. Your next colonoscopy is due in May 2026. Continue with routine vision and dental check-ups.                       "

## 2024-04-16 LAB — COMPREHENSIVE METABOLIC PANEL WITH GFR
AG Ratio: 1.1 (calc) (ref 1.0–2.5)
ALT: 12 U/L (ref 6–29)
AST: 13 U/L (ref 10–35)
Albumin: 3.7 g/dL (ref 3.6–5.1)
Alkaline phosphatase (APISO): 100 U/L (ref 37–153)
BUN: 11 mg/dL (ref 7–25)
CO2: 30 mmol/L (ref 20–32)
Calcium: 8.9 mg/dL (ref 8.6–10.4)
Chloride: 100 mmol/L (ref 98–110)
Creat: 0.58 mg/dL (ref 0.50–1.03)
Globulin: 3.3 g/dL (ref 1.9–3.7)
Glucose, Bld: 134 mg/dL — ABNORMAL HIGH (ref 65–99)
Potassium: 3.5 mmol/L (ref 3.5–5.3)
Sodium: 138 mmol/L (ref 135–146)
Total Bilirubin: 0.2 mg/dL (ref 0.2–1.2)
Total Protein: 7 g/dL (ref 6.1–8.1)
eGFR: 110 mL/min/1.73m2

## 2024-04-16 LAB — LIPID PANEL
Cholesterol: 228 mg/dL — ABNORMAL HIGH
HDL: 67 mg/dL
LDL Cholesterol (Calc): 138 mg/dL — ABNORMAL HIGH
Non-HDL Cholesterol (Calc): 161 mg/dL — ABNORMAL HIGH
Total CHOL/HDL Ratio: 3.4 (calc)
Triglycerides: 111 mg/dL

## 2024-04-18 ENCOUNTER — Ambulatory Visit: Payer: Self-pay | Admitting: Family

## 2025-04-04 ENCOUNTER — Ambulatory Visit: Admitting: Neurology
